# Patient Record
Sex: Male | Born: 1980 | Race: Black or African American | Hispanic: No | Marital: Single | Smoking: Never smoker
Health system: Southern US, Community
[De-identification: ages and names within clinical notes are randomized; demographics above are authoritative.]

## PROBLEM LIST (undated history)

## (undated) DIAGNOSIS — I8390 Asymptomatic varicose veins of unspecified lower extremity: Secondary | ICD-10-CM

## (undated) DIAGNOSIS — R011 Cardiac murmur, unspecified: Secondary | ICD-10-CM

## (undated) DIAGNOSIS — I519 Heart disease, unspecified: Secondary | ICD-10-CM

## (undated) DIAGNOSIS — J301 Allergic rhinitis due to pollen: Secondary | ICD-10-CM

## (undated) DIAGNOSIS — E039 Hypothyroidism, unspecified: Secondary | ICD-10-CM

## (undated) DIAGNOSIS — M1990 Unspecified osteoarthritis, unspecified site: Secondary | ICD-10-CM

## (undated) DIAGNOSIS — L708 Other acne: Secondary | ICD-10-CM

## (undated) DIAGNOSIS — D72819 Decreased white blood cell count, unspecified: Secondary | ICD-10-CM

## (undated) DIAGNOSIS — Q909 Down syndrome, unspecified: Secondary | ICD-10-CM

## (undated) HISTORY — DX: Other acne: L70.8

## (undated) HISTORY — PX: MITRAL VALVE REPAIR: SHX2039

## (undated) HISTORY — DX: Heart disease, unspecified: I51.9

## (undated) HISTORY — DX: Asymptomatic varicose veins of unspecified lower extremity: I83.90

## (undated) HISTORY — PX: OTHER SURGICAL HISTORY: SHX169

## (undated) HISTORY — DX: Allergic rhinitis due to pollen: J30.1

## (undated) HISTORY — DX: Decreased white blood cell count, unspecified: D72.819

## (undated) HISTORY — DX: Down syndrome, unspecified: Q90.9

---
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
## 2000-12-22 ENCOUNTER — Emergency Department (HOSPITAL_COMMUNITY): Admission: EM | Admit: 2000-12-22 | Discharge: 2000-12-22 | Payer: Self-pay | Admitting: Emergency Medicine

## 2001-06-30 ENCOUNTER — Ambulatory Visit (HOSPITAL_COMMUNITY): Admission: RE | Admit: 2001-06-30 | Discharge: 2001-06-30 | Payer: Self-pay | Admitting: Orthopedic Surgery

## 2001-06-30 ENCOUNTER — Encounter: Payer: Self-pay | Admitting: Orthopedic Surgery

## 2004-11-06 ENCOUNTER — Ambulatory Visit: Payer: Self-pay | Admitting: Cardiovascular Disease

## 2004-12-02 ENCOUNTER — Ambulatory Visit: Payer: Self-pay

## 2006-12-26 ENCOUNTER — Ambulatory Visit: Payer: Self-pay | Admitting: Cardiovascular Disease

## 2006-12-26 ENCOUNTER — Encounter: Payer: Self-pay | Admitting: Cardiovascular Disease

## 2006-12-26 ENCOUNTER — Ambulatory Visit: Payer: Self-pay

## 2008-03-29 ENCOUNTER — Ambulatory Visit: Payer: Self-pay | Admitting: Cardiovascular Disease

## 2008-04-08 ENCOUNTER — Encounter: Payer: Self-pay | Admitting: Cardiovascular Disease

## 2008-04-08 ENCOUNTER — Ambulatory Visit: Payer: Self-pay

## 2009-01-10 DIAGNOSIS — Q909 Down syndrome, unspecified: Secondary | ICD-10-CM | POA: Insufficient documentation

## 2009-01-10 DIAGNOSIS — I519 Heart disease, unspecified: Secondary | ICD-10-CM | POA: Insufficient documentation

## 2009-01-10 DIAGNOSIS — J301 Allergic rhinitis due to pollen: Secondary | ICD-10-CM | POA: Insufficient documentation

## 2009-01-10 DIAGNOSIS — L708 Other acne: Secondary | ICD-10-CM | POA: Insufficient documentation

## 2009-01-13 ENCOUNTER — Ambulatory Visit: Payer: Self-pay | Admitting: Cardiovascular Disease

## 2009-01-13 DIAGNOSIS — I080 Rheumatic disorders of both mitral and aortic valves: Secondary | ICD-10-CM | POA: Insufficient documentation

## 2009-01-13 DIAGNOSIS — I4510 Unspecified right bundle-branch block: Secondary | ICD-10-CM | POA: Insufficient documentation

## 2009-04-25 ENCOUNTER — Ambulatory Visit: Payer: Self-pay

## 2009-04-25 ENCOUNTER — Encounter: Payer: Self-pay | Admitting: Cardiovascular Disease

## 2009-11-19 ENCOUNTER — Encounter (INDEPENDENT_AMBULATORY_CARE_PROVIDER_SITE_OTHER): Payer: Self-pay | Admitting: *Deleted

## 2010-01-16 ENCOUNTER — Ambulatory Visit: Payer: Self-pay | Admitting: Cardiovascular Disease

## 2010-05-07 ENCOUNTER — Ambulatory Visit: Payer: Self-pay | Admitting: Cardiology

## 2010-05-07 ENCOUNTER — Encounter: Payer: Self-pay | Admitting: Cardiovascular Disease

## 2010-05-07 ENCOUNTER — Ambulatory Visit: Payer: Self-pay

## 2010-05-07 ENCOUNTER — Ambulatory Visit (HOSPITAL_COMMUNITY): Admission: RE | Admit: 2010-05-07 | Discharge: 2010-05-07 | Payer: Self-pay | Admitting: Cardiovascular Disease

## 2010-06-29 ENCOUNTER — Encounter: Admission: RE | Admit: 2010-06-29 | Discharge: 2010-06-29 | Payer: Self-pay | Admitting: Family Medicine

## 2010-07-06 ENCOUNTER — Telehealth: Payer: Self-pay | Admitting: Cardiovascular Disease

## 2010-07-09 ENCOUNTER — Encounter (INDEPENDENT_AMBULATORY_CARE_PROVIDER_SITE_OTHER): Payer: Self-pay | Admitting: *Deleted

## 2010-07-29 ENCOUNTER — Ambulatory Visit: Payer: Self-pay | Admitting: Oncology

## 2010-08-27 LAB — CBC WITH DIFFERENTIAL/PLATELET
BASO%: 0.5 % (ref 0.0–2.0)
Basophils Absolute: 0 10*3/uL (ref 0.0–0.1)
EOS%: 0.5 % (ref 0.0–7.0)
Eosinophils Absolute: 0 10*3/uL (ref 0.0–0.5)
HCT: 46.1 % (ref 38.4–49.9)
HGB: 16.1 g/dL (ref 13.0–17.1)
LYMPH%: 28.2 % (ref 14.0–49.0)
MCH: 34.1 pg — ABNORMAL HIGH (ref 27.2–33.4)
MCHC: 35 g/dL (ref 32.0–36.0)
MCV: 97.5 fL (ref 79.3–98.0)
MONO#: 0.4 10*3/uL (ref 0.1–0.9)
MONO%: 11.7 % (ref 0.0–14.0)
NEUT#: 2.2 10*3/uL (ref 1.5–6.5)
NEUT%: 59.1 % (ref 39.0–75.0)
Platelets: 155 10*3/uL (ref 140–400)
RBC: 4.73 10*6/uL (ref 4.20–5.82)
RDW: 13.6 % (ref 11.0–14.6)
WBC: 3.7 10*3/uL — ABNORMAL LOW (ref 4.0–10.3)
lymph#: 1 10*3/uL (ref 0.9–3.3)

## 2010-08-27 LAB — CHCC SMEAR

## 2010-08-27 LAB — MORPHOLOGY: PLT EST: ADEQUATE

## 2010-08-28 LAB — ANA: Anti Nuclear Antibody(ANA): POSITIVE — AB

## 2010-08-28 LAB — COMPREHENSIVE METABOLIC PANEL
ALT: 19 U/L (ref 0–53)
AST: 30 U/L (ref 0–37)
Albumin: 4.3 g/dL (ref 3.5–5.2)
Alkaline Phosphatase: 83 U/L (ref 39–117)
BUN: 13 mg/dL (ref 6–23)
CO2: 28 mEq/L (ref 19–32)
Calcium: 9 mg/dL (ref 8.4–10.5)
Chloride: 104 mEq/L (ref 96–112)
Creatinine, Ser: 1.04 mg/dL (ref 0.40–1.50)
Glucose, Bld: 93 mg/dL (ref 70–99)
Potassium: 4.7 mEq/L (ref 3.5–5.3)
Sodium: 142 mEq/L (ref 135–145)
Total Bilirubin: 0.6 mg/dL (ref 0.3–1.2)
Total Protein: 7.8 g/dL (ref 6.0–8.3)

## 2010-08-28 LAB — ANTI-NUCLEAR AB-TITER (ANA TITER): ANA Titer 1: 1:40 {titer} — ABNORMAL HIGH

## 2010-08-28 LAB — RHEUMATOID FACTOR: Rheumatoid fact SerPl-aCnc: <10 IU/mL (ref <=14)

## 2010-08-28 LAB — LACTATE DEHYDROGENASE: LDH: 171 U/L (ref 94–250)

## 2010-09-08 NOTE — Letter (Signed)
Summary: Generic Letter  [employer], Main Office  [address]   [place], [place] [address]   Phone: [phone]  Fax: [phone]        July 09, 2010 MRN: [id number]    [facility] [address] [place], [place]  [address]   TO WHOM IT MAY CONCERN:          Roberto Schultz is followed in our cardiology clinic for conditions related to his diagnosis of Down's syndrome. He is unable to make decisions on his own. Please take this into consideration when considering guardianship for [facility]. Please call with any questions or concerns.   Sincerely,  Dr Theron Arista Verdis Prime, RN

## 2010-09-08 NOTE — Assessment & Plan Note (Signed)
Summary: f1y/per check out      Allergies Added: NKDA  CC:  f1y/  Pt states he has been doing well.  History of Present Illness: Roberto Schultz is seen today in followup for congenital heart disease.  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  He's had it repaired.  There's been no residual RV or LV dysfunction.  He has no residual shunts.  His last echocardiogram was September of 2010.  He had normal LV function.  He did have prolapse of the posterior leaflet with what was described as moderate mitral regurg.  He will likely need a followup echocardiogram next year.  He continues to use SBE prophylaxis.  As of his endocardial cushion defect he has a chronic right bundle branch block with left axis deviation.  There's been no evidence of high-grade heart block.  He is living in a group home now he seems to be functioning well there.  He is not driving.  He has no new complaints since I last saw him.  In particular there is been no chest pain PND orthopnea dyspnea no palpitations syncope or evidence of congestive heart failure.    Current Problems (verified): 1)  Bundle Branch Block, Right  (ICD-426.4) 2)  Mitral Regurgitation, 2 Plus  (ICD-396.3) 3)  Down Syndrome  (ICD-758.0) 4)  Heart Disease  (ICD-429.9) 5)  Acne, Mild  (ICD-706.1) 6)  Allergic Rhinitis, Seasonal  (ICD-477.0)  Current Medications (verified): 1)  Cetirizine Hcl 10 Mg Tabs (Cetirizine Hcl) .Marland Kitchen.. 1 Tab By Mouth Once Daily 2)  Digoxin 0.25 Mg Tabs (Digoxin) .... Take One Tablet By Mouth Daily 3)  Minocycline Hcl 100 Mg Tabs (Minocycline Hcl) .Marland Kitchen.. 1 Tab By Mouth Two Times A Day 4)  Clindamycin Phosphate 1 % Soln (Clindamycin Phosphate) .... Apply Two Times A Day 5)  Desonide 0.05 % Crea (Desonide) .... Apply To Neck Two Times A Day 6)  Triamcinolone Acetonide 0.1 % Crea (Triamcinolone Acetonide) .... Apply To Back Two Times A Day 7)  Multivitamins  Tabs (Multiple Vitamin) .Marland Kitchen.. 1 Tab By Mouth Once  Daily  Allergies (verified): No Known Drug Allergies  Past History:  Past Medical History: Last updated: 01/10/2009 Current Problems:  DOWN SYNDROME (ICD-758.0) endocardial cushion defect repair HEART DISEASE (ICD-429.9) MVP with MR ACNE, MILD (ICD-706.1) ALLERGIC RHINITIS, SEASONAL (ICD-477.0)  Past Surgical History: Last updated: 01/10/2009  endocardial cushion defect repair.  Family History: Last updated: 01/10/2009 noncontributary  Social History: Last updated: 01/10/2009  He is still living at the home and goes to school.  He has been   fairly active.  He has a basketball court in his backyard and plays   almost daily.   Review of Systems       Denies fever, malais, weight loss, blurry vision, decreased visual acuity, cough, sputum, SOB, hemoptysis, pleuritic pain, palpitaitons, heartburn, abdominal pain, melena, lower extremity edema, claudication, or rash.   Vital Signs:  Patient profile:   30 year old male Height:      62 inches Weight:      131 pounds BMI:     24.05 Pulse rate:   59 / minute Pulse rhythm:   irregular BP sitting:   116 / 76  (left arm) Cuff size:   regular  Vitals Entered By: Judithe Modest CMA (January 16, 2010 3:21 PM)  Physical Exam  General:  Affect appropriate Healthy:  appears stated age HEENT: normal Neck supple with no adenopathy JVP normal no bruits no thyromegaly Lungs  clear with no wheezing and good diaphragmatic motion Heart:  S1/S2 MR  murmur, no rub, gallop or click PMI normal Abdomen: benighn, BS positve, no tenderness, no AAA no bruit.  No HSM or HJR Distal pulses intact with no bruits No edema LLE large varicose vein Neuro non-focal Skin warm and dry    Impression & Recommendations:  Problem # 1:  BUNDLE BRANCH BLOCK, RIGHT (ICD-426.4)  No evidence of high grade heart block  No change in ECG  Orders: EKG w/ Interpretation (93[address])  Problem # 2:  MITRAL REGURGITATION, 2 PLUS (ICD-396.3)  F/U echo in  September.  Murmur unchanged  Orders: EKG w/ Interpretation (93000) Echocardiogram (Echo)  Problem # 3:  HEART DISEASE (ICD-429.9) S/P endocardial cushon defect repair.  No residual shunt.  Continue SBE prophylaxis   Patient Instructions: 1)  Your physician recommends that you schedule a follow-up appointment in: YEAR WITH DR Eden Emms 2)  Your physician recommends that you continue on your current medications as directed. Please refer to the Current Medication list given to you today. 3)  Your physician has requested that you have an echocardiogram.  Echocardiography is a painless test that uses sound waves to create images of your heart. It provides your doctor with information about the size and shape of your heart and how well your heart's chambers and valves are working.  This procedure takes approximately one hour. There are no restrictions for this procedure.DUE SEPTEMBER   EKG Report  Procedure date:  01/16/2010  Findings:      SR 58 RBBB LAFB Bifasicular Block

## 2010-09-08 NOTE — Progress Notes (Signed)
Summary: Need letter for pt   Phone Note Call from Patient Call back at [phone] or [phone]   Caller: Mom/ Juanita Craver Summary of Call: Pt mother calling about getting letter for her son from Dr.Nishan Initial call taken by: Judie Grieve,  July 06, 2010 3:37 PM  Follow-up for Phone Call        Left message to call back Deliah Goody, RN  July 06, 2010 5:58 PM   pt's mom returning call-[phone] Glynda Jaeger  July 08, 2010 1:54 PM  spoke with pt mother, she is requesting a letter stating pt has downs syndrome and is unable to make decisions on his own so she can get guardianship for the group home he is staying in. will discuss with dr Verdis Prime, RN  July 08, 2010 2:36 PM   Additional Follow-up for Phone Call Additional follow up Details #1::        discussed with dr Eden Emms, note generated and mailed to pt home address Deliah Goody, RN  July 09, 2010 2:12 PM

## 2010-09-08 NOTE — Letter (Signed)
Summary: Appointment - Reminder 2  [employer], Main Office  [address]   [place], [place] [address]   Phone: [phone]  Fax: [phone]     November 19, 2009 MRN: [id number]   [facility] [address] [place], [place]  [address]   Dear Mr. Carranco,  Our records indicate that it is time to schedule a follow-up appointment with Dr. Eden Emms. It is very important that we reach you to schedule this appointment. We look forward to participating in your health care needs. Please contact us at the number listed above at your earliest convenience to schedule your appointment.  If you are unable to make an appointment at this time, give [place] a call so we can update our records.     Sincerely,   Migdalia Dk [facility] Scheduling Team

## 2010-12-22 NOTE — Assessment & Plan Note (Signed)
[facility]                            CARDIOLOGY OFFICE NOTE   VASCO, CHONG                    MRN:          [id number]  DATE:12/26/2006                            DOB:          [birth year]    Roberto Schultz returns today for followup.  He is a congenital heart disease  patient with previous endocardial cushion defect.   He has Down's Syndrome.   In talking to the patient and his mother, he has been doing extremely  well.  He is still living at the home and goes to school.  He has been  fairly active.  He has a basketball court in his backyard and plays  almost daily.   He has not had any significant PND or orthopnea, no palpitations or  syncope.   His last echo in April of 2006 showed no residual ASD or VSD.  He had a  mild aortic insufficiency and moderate mitral insufficiency.   He has not noticed any significant problems relevant to his heart.  His  teeth are good shape.  He does continue to take SBE prophylaxis.   The patient's primary care physician is Dr. Dorothe Pea.  He has had some  seasonal allergies but review of systems are otherwise negative.   MEDICATIONS:  He is on Digitek 0.25 mg a day, multi-vitamins.   EXAMINATION:  GENERAL:  Remarkable for a healthy-appearing young black  individual with a very happy affect.  He does have a mild speech  impediment.  He has a large keloid over his chest from his previous  congenital heart surgery.  His weight is 131 pounds.  Blood pressure is  118/70, pulse 61 and regular, respiratory rate is 12.  He is afebrile.  HEENT:  Normal.  NECK:  Carotids are normal without bruits.  JVP is not elevated.  There  is no thyromegaly, no lymphadenopathy.  LUNGS:  Clear with no wheezing and normal diaphragmatic motion.  Both  first and second heart sounds are split.  There is a mild aortic  insufficiency murmur and a mild MR murmur.  PMI is mildly increased but  not displaced.  There is a large keloid  over his chest.  ABDOMEN:  Benign.  Bowel sounds are positive.  There is no tenderness,  no hepatosplenomegaly, no hepatojugular reflux.  No renal bruits or AAA.  Distal pulses are intact with no edema.  NEURO:  Nonfocal.  MUSCULOSKELETAL:  Normal with no weakness.   EKG shows sinus rhythm with a PR interval of 170 and a right bundle  branch block.  These are chronic.   IMPRESSION:  Stable endocardial cushion defect without residual ASD or  VSD.  Patient has typical conduction defects that one would see with an  endocardial cushion defect.  His right bundle branch block and PR  interval are stable with no evidence of syncope or high-grade heart  block.   He will continue his digoxin, although I did not start him on this.  The  patient and mom have been somewhat hesitant to stop it.  If this heart  block were to worsen, we certainly can.  In regards to his valve disease, he needs a follow-up echo.  He will  continue SBE prophylaxis.  We will try to do his echo today in the  office.  So long as his Mr. and Marian Sorrow are stable, he will have a follow-up  echo in two years.   He has been taking his amoxicillin appropriately.   Overall, despite his Down's syndrome and previous congenital heart  disease, he is doing extremely well.     [name]. Eden Emms, MD, [facility]  Electronically Signed    PCN/MedQ  DD: 12/26/2006  DT: 12/26/2006  Job #: [id number]

## 2010-12-22 NOTE — Assessment & Plan Note (Signed)
[facility]                            CARDIOLOGY OFFICE NOTE   Roberto Schultz, Roberto Schultz                    MRN:          [id number]  DATE:03/29/2008                            DOB:          11-23-1980    Roberto Schultz returns today for followup.  He is 30 years old.  He has had  previous endocardial cushion defect repair.  He has had residual mild-to-  moderate MR and mild AI.  He needs to have a followup echocardiogram.   The patient has typical features of conduction delay from an endocardial  cushion defect.  He has had left axis deviation, relative bradycardia,  and a right bundle-branch block.  His previous doctors had placed him on  digoxin.  He was hesitant to stop this because he has had benign on it  so long.  I had previously explained the relationship between the age  and AV nodal block, but the patient and his parents wanted to continue  the drug.  Fortunately, his conduction abnormalities have not changed at  all.  He is not having any significant palpitations, presyncope,  syncope, or relative bradycardia.   He is active.  He is not having chest pain or shortness of breath.   His review of system is remarkable for the fact that Roberto Schultz is now  living in a group home, since he has been living in a group home.  His  skin condition is deteriorated some with some acne.  He is on both  topical and oral antibiotics for this.   Other than this, there had been no significant changes.   Roberto Schultz is doing well.  He continues to take life skill courses and  math courses at [employer] and he is working at [employer] in [place],  which sounds like a [employer].   CURRENT MEDICATIONS:  1. Multivitamin.  2. Zyrtec.  3. Lanoxin 0.25 a day.  4. Minocycline 100 b.i.d.  5. Clindamycin facial solution.   PHYSICAL EXAMINATION:  GENERAL:  His exam is remarkable for a older Down  syndrome black male in no distress.  VITAL SIGNS:  Weight is 138,  blood pressure is 126/75, pulse 55 and  regular, respiratory rate 14, afebrile.  HEENT:  Unremarkable.  CAROTIDS:  Normal without bruit.  No lymphadenopathy, no thyromegaly, no  JVP elevation.  LUNGS:  Clear to diaphragmatic motion.  No wheezing.  There is an S1-S2  with an MR murmur at the apex.  He is status post sternotomy from  congenital surgery from his endocardial cushion defect.  ABDOMEN:  Benign.  Bowel sounds positive.  No AAA, no tenderness, no  bruit, no hepatosplenomegaly, no hepatojugular reflux, no tenderness.  EXTREMITIES:  Distal pulses intact.  No edema.  NEURO:  Nonfocal.  SKIN:  Does have some acne over the back and malar regions of the face.  NEURO:  Nonfocal.  No muscular weakness.   EKG shows sinus rhythm with left axis deviation and right bundle-branch  block.  PR interval is 170.  QT interval is 446.   IMPRESSION:  1. Endocardial cushion defect, status post surgery.  No  evidence of      residual shunt.  2. Mild-to-moderate mitral regurgitation and mild aortic      insufficiency.  Followup echocardiogram to assess severity.  3. Conduction abnormality secondary to endocardial cushion defect,      left axis deviation, right bundle, low threshold, to stop digoxin,      no evidence of chronotropic incompetence at this time.  4. Seasonal allergies.  Continue Zyrtec.  5. Acne.  Continue minocycline and clindamycin.  Followup with      Dermatology.   I will see him back in a year so long as his echo has not changed so  much.     [name]. Eden Emms, MD, [facility]  Electronically Signed    PCN/MedQ  DD: 03/29/2008  DT: 03/30/2008  Job #: [id number]

## 2011-01-19 ENCOUNTER — Encounter: Payer: Self-pay | Admitting: Cardiovascular Disease

## 2011-01-20 ENCOUNTER — Ambulatory Visit (INDEPENDENT_AMBULATORY_CARE_PROVIDER_SITE_OTHER): Payer: Medicaid Other | Admitting: Cardiovascular Disease

## 2011-01-20 ENCOUNTER — Encounter: Payer: Self-pay | Admitting: Cardiovascular Disease

## 2011-01-20 DIAGNOSIS — I4510 Unspecified right bundle-branch block: Secondary | ICD-10-CM

## 2011-01-20 DIAGNOSIS — I080 Rheumatic disorders of both mitral and aortic valves: Secondary | ICD-10-CM

## 2011-01-20 DIAGNOSIS — Q909 Down syndrome, unspecified: Secondary | ICD-10-CM

## 2011-01-20 NOTE — Progress Notes (Signed)
30 yo with Downs Syndrome living well at group home.  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  Normal EF  Last echo 9/11 reviewed.  No dyspnea, palpitations, SSCP, edema.  No longer playing basketball.  Seeing dentist at least twice a year.  His dad was with him today.  Older sister doing well and graduated with job at credit union.  Middle son not so well hanging around "rappers" and has an unwed child he is not supporting.  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  No evidence of progressive heart block  ROS: Denies fever, malais, weight loss, blurry vision, decreased visual acuity, cough, sputum, SOB, hemoptysis, pleuritic pain, palpitaitons, heartburn, abdominal pain, melena, lower extremity edema, claudication, or rash.  All other systems reviewed and negative  General: Affect appropriate Healthy: with Down Syndrome  HEENT: normal Neck supple with no adenopathy JVP normal no bruits no thyromegaly Lungs clear with no wheezing and good diaphragmatic motion Heart:  S1 split /S2 Mild AR  murmur,rub, gallop or click PMI normal Abdomen: benighn, BS positve, no tenderness, no AAA no bruit.  No HSM or HJR Distal pulses intact with no bruits No edema Neuro non-focal Skin warm and dry No muscular weakness   Current Outpatient Prescriptions  Medication Sig Dispense Refill  . cetirizine (ZYRTEC) 10 MG chewable tablet Chew 10 mg by mouth daily.        . digoxin (LANOXIN) 0.25 MG tablet Take 250 mcg by mouth daily.        . Multiple Vitamin (MULTIVITAMIN) tablet Take 1 tablet by mouth daily.          Allergies  Review of patient's allergies indicates no known allergies.  Electrocardiogram:   NSR 65 LAD RBBB no change frmo 2011  Assessment and Plan

## 2011-01-20 NOTE — Assessment & Plan Note (Signed)
Stable secondary to AV canal defect.  Yearly ECG

## 2011-01-20 NOTE — Patient Instructions (Signed)
Your physician wants you to follow-up in: one year You will receive a reminder letter in the mail two months in advance. If you don't receive a letter, please call our office to schedule the follow-up appointment.  

## 2011-01-20 NOTE — Assessment & Plan Note (Signed)
Doing well living independantly at group home

## 2011-01-20 NOTE — Assessment & Plan Note (Signed)
Mild.  Only mild AR heard on exam with split S1  F/U echo 9/13

## 2011-02-25 ENCOUNTER — Encounter (HOSPITAL_BASED_OUTPATIENT_CLINIC_OR_DEPARTMENT_OTHER): Payer: Medicaid Other | Admitting: Oncology

## 2011-02-25 ENCOUNTER — Other Ambulatory Visit (HOSPITAL_COMMUNITY): Payer: Self-pay | Admitting: Oncology

## 2011-02-25 DIAGNOSIS — D72819 Decreased white blood cell count, unspecified: Secondary | ICD-10-CM

## 2011-02-25 LAB — CBC WITH DIFFERENTIAL/PLATELET
BASO%: 0.4 % (ref 0.0–2.0)
Basophils Absolute: 0 10*3/uL (ref 0.0–0.1)
HCT: 45.2 % (ref 38.4–49.9)
HGB: 15.6 g/dL (ref 13.0–17.1)
MCHC: 34.6 g/dL (ref 32.0–36.0)
MONO#: 0.5 10*3/uL (ref 0.1–0.9)
NEUT#: 1.6 10*3/uL (ref 1.5–6.5)
NEUT%: 47.9 % (ref 39.0–75.0)
WBC: 3.4 10*3/uL — ABNORMAL LOW (ref 4.0–10.3)
lymph#: 1.2 10*3/uL (ref 0.9–3.3)

## 2011-06-14 ENCOUNTER — Other Ambulatory Visit: Payer: Self-pay | Admitting: Family Medicine

## 2011-06-14 ENCOUNTER — Ambulatory Visit
Admission: RE | Admit: 2011-06-14 | Discharge: 2011-06-14 | Disposition: A | Discharge status: Home / Self Care | Payer: Medicaid Other | Source: Ambulatory Visit | Attending: Family Medicine | Admitting: Family Medicine

## 2011-06-14 DIAGNOSIS — S93409A Sprain of unspecified ligament of unspecified ankle, initial encounter: Secondary | ICD-10-CM

## 2011-08-06 ENCOUNTER — Telehealth: Payer: Self-pay | Admitting: Oncology

## 2011-08-06 NOTE — Telephone Encounter (Signed)
S/w the pt and he is aware of his feb 2013 appts °

## 2011-09-16 ENCOUNTER — Telehealth: Payer: Self-pay | Admitting: Medical Oncology

## 2011-09-16 NOTE — Telephone Encounter (Signed)
Roberto Schultz at group home called to ask about appointments next week. Gave her dates  For appointment and time.

## 2011-09-20 ENCOUNTER — Encounter: Payer: Self-pay | Admitting: Oncology

## 2011-09-20 ENCOUNTER — Other Ambulatory Visit: Payer: Medicaid Other | Admitting: Lab

## 2011-09-20 ENCOUNTER — Ambulatory Visit (HOSPITAL_BASED_OUTPATIENT_CLINIC_OR_DEPARTMENT_OTHER): Payer: Medicaid Other | Admitting: Oncology

## 2011-09-20 VITALS — BP 108/69 | HR 78 | Temp 97.4°F | Ht 63.0 in | Wt 133.6 lb

## 2011-09-20 DIAGNOSIS — D72819 Decreased white blood cell count, unspecified: Secondary | ICD-10-CM

## 2011-09-20 LAB — CBC WITH DIFFERENTIAL/PLATELET
BASO%: 0.3 % (ref 0.0–2.0)
EOS%: 0.2 % (ref 0.0–7.0)
HCT: 46.3 % (ref 38.4–49.9)
LYMPH%: 23 % (ref 14.0–49.0)
MCH: 33.8 pg — ABNORMAL HIGH (ref 27.2–33.4)
MCHC: 34.4 g/dL (ref 32.0–36.0)
MONO#: 0.5 10*3/uL (ref 0.1–0.9)
NEUT%: 64.4 % (ref 39.0–75.0)
Platelets: 177 10*3/uL (ref 140–400)
RBC: 4.72 10*6/uL (ref 4.20–5.82)
WBC: 4.4 10*3/uL (ref 4.0–10.3)

## 2011-09-20 LAB — COMPREHENSIVE METABOLIC PANEL
ALT: 17 U/L (ref 0–53)
AST: 25 U/L (ref 0–37)
Alkaline Phosphatase: 74 U/L (ref 39–117)
Creatinine, Ser: 1.14 mg/dL (ref 0.50–1.35)
Sodium: 138 mEq/L (ref 135–145)
Total Bilirubin: 0.7 mg/dL (ref 0.3–1.2)
Total Protein: 7.4 g/dL (ref 6.0–8.3)

## 2011-09-20 NOTE — Progress Notes (Signed)
CC:   Roberto Koirala, MD  PROBLEM LIST: 1. Leukopenia. 2. Down syndrome. 3. Status post mitral valve repair at age 31 months. 4. Eczema. 5. Status post right hip fracture requiring surgical pinning in 1996.  MEDICATIONS: 1. Benzoyl peroxide 5% gel applied daily. 2. Zyrtec 10 mg chewable tablets daily. 3. Cleocin 1% lotion applied twice a day. 4. Desonide 0.05% cream, apply as needed. 5. Digoxin 0.25 mg daily. 6. Flonase nasal spray 2 sprays as needed. 7. Minocycline 100 mg twice daily. 8. Multivitamins 1 daily. 9. Kenalog 0.1% cream, apply as needed.  HISTORY:  Roberto Schultz is here today with his dad, [name].  Roberto Schultz was last seen by [place] on 02/25/2011, at which time his white count was 3.4. We had 1st seen Roberto Schultz on referral on 08/27/2010 for evaluation of leukopenia.  At that time, his white count was 3.7 with an ANC of 2.2. The rest of his CBC was normal.  It was our feelings at that time that his leukopenia was not clinically significant, although I could not exclude the possibility that 1 or more of his medicines might have been a contributing factor.  Roberto Schultz has done well and has no problems or complaints.  He resides in a group home called [facility] run by [facility].  The facility is located in [facility].  Roberto Schultz works several days a week at [employer].  He has not had any infections or fever.  PHYSICAL EXAMINATION:  General:  He is of short stature, very friendly and pleasant and cooperative.  He seems to be fairly high functioning. Vital Signs:  Weight is 133 pounds 9.6 ounces, height 5 feet 3 inches, body surface area 1.64 sq m.  Blood pressure 108/69.  Other vital signs are normal.  HEENT:  There is no scleral icterus.  Mouth and pharynx are benign.  There is no peripheral adenopathy palpable.  Heart and Lungs: Normal.  There is a prominent scar from his cardiac surgery.  Abdomen: With the patient sitting is benign with no organomegaly or masses palpable.  Extremities:  No  peripheral edema or clubbing.  Neurologic Exam:  Grossly normal.  LABORATORY DATA:  Today white count 4.4, ANC 2.8, hemoglobin 15.9, hematocrit 46.3, platelets 177,000.  MCV and MCH were minimally elevated at 98.1 and 33.8 respectively.  Chemistries are pending.  We have chemistries from 08/27/2010 that were normal.  Apparently Roberto Schultz had recently a slightly elevated TSH and a slightly low thyroid hormone. These tests will be repeated.  IMPRESSION AND PLAN:  Roberto Schultz was originally seen by [place] for mild leukopenia.  Today his CBC is normal.  We last saw Roberto Schultz on 02/25/2011. It has been our impression that he has a mild benign leukopenia without any clinical significance.  Once again, the patient and his dad were reassured.  We gave a copy of the CBCs to the patient's father.  I do not think Roberto Schultz needs to be seen by [place] again, although I would be happy to see him should any questions or concerns arise in the future.    ______________________________ Samul Dada, M.D. DSM/MEDQ  D:  09/20/2011  T:  09/20/2011  Job:  [id number]

## 2011-09-20 NOTE — Progress Notes (Signed)
This office note has been dictated.  #[id number]

## 2011-11-04 ENCOUNTER — Other Ambulatory Visit: Payer: Self-pay | Admitting: Family Medicine

## 2011-11-04 ENCOUNTER — Ambulatory Visit
Admission: RE | Admit: 2011-11-04 | Discharge: 2011-11-04 | Disposition: A | Discharge status: Home / Self Care | Payer: Medicaid Other | Source: Ambulatory Visit | Attending: Family Medicine | Admitting: Family Medicine

## 2011-11-04 DIAGNOSIS — M25551 Pain in right hip: Secondary | ICD-10-CM

## 2011-11-04 DIAGNOSIS — M25552 Pain in left hip: Secondary | ICD-10-CM

## 2012-04-12 ENCOUNTER — Ambulatory Visit (INDEPENDENT_AMBULATORY_CARE_PROVIDER_SITE_OTHER): Payer: Medicaid Other | Admitting: Cardiovascular Disease

## 2012-04-12 ENCOUNTER — Encounter: Payer: Self-pay | Admitting: Cardiovascular Disease

## 2012-04-12 VITALS — BP 104/60 | HR 58 | Ht 60.0 in | Wt 138.0 lb

## 2012-04-12 DIAGNOSIS — I4510 Unspecified right bundle-branch block: Secondary | ICD-10-CM

## 2012-04-12 DIAGNOSIS — R011 Cardiac murmur, unspecified: Secondary | ICD-10-CM

## 2012-04-12 DIAGNOSIS — Z79899 Other long term (current) drug therapy: Secondary | ICD-10-CM

## 2012-04-12 DIAGNOSIS — I8390 Asymptomatic varicose veins of unspecified lower extremity: Secondary | ICD-10-CM

## 2012-04-12 LAB — CBC WITH DIFFERENTIAL/PLATELET
Basophils Absolute: 0 10*3/uL (ref 0.0–0.1)
Eosinophils Absolute: 0 10*3/uL (ref 0.0–0.7)
Hemoglobin: 15.1 g/dL (ref 13.0–17.0)
Lymphocytes Relative: 30.4 % (ref 12.0–46.0)
MCHC: 33.6 g/dL (ref 30.0–36.0)
MCV: 98.4 fl (ref 78.0–100.0)
Monocytes Absolute: 0.5 10*3/uL (ref 0.1–1.0)
Neutro Abs: 1.5 10*3/uL (ref 1.4–7.7)
RDW: 13 % (ref 11.5–14.6)

## 2012-04-12 LAB — BASIC METABOLIC PANEL
BUN: 14 mg/dL (ref 6–23)
Calcium: 8.8 mg/dL (ref 8.4–10.5)
Chloride: 102 mEq/L (ref 96–112)
Creatinine, Ser: 1.2 mg/dL (ref 0.4–1.5)
GFR: 95.53 mL/min (ref >60.00)

## 2012-04-12 NOTE — Progress Notes (Signed)
Patient ID: Roberto Schultz, male   DOB: 1980-09-28, 31 y.o.   MRN: [id number] 31 yo with Downs Syndrome living well at group home. 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 Normal EF Last echo 9/11 reviewed. No dyspnea, palpitations, SSCP, edema. No longer playing basketball. Seeing dentist at least twice a year. His dad was with him today. Older sister doing well and graduated with job at credit union. Middle son not so well hanging around "rappers" and has an unwed child he is not supporting. 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 No evidence of progressive heart block  W/U for leukopenia Dr Arline Asp ? Related to antibiotic stopped and improving.  Has bad varicose veins and rash on back.    ROS: Denies fever, malais, weight loss, blurry vision, decreased visual acuity, cough, sputum, SOB, hemoptysis, pleuritic pain, palpitaitons, heartburn, abdominal pain, melena, lower extremity edema, claudication, or rash.  All other systems reviewed and negative  General: Affect appropriate Healthy:  appears stated age HEENT: normal Downs syndrome facies Neck supple with no adenopathy JVP normal no bruits no thyromegaly Lungs clear with no wheezing and good diaphragmatic motion Heart:  S1/S2 SEM murmur, no rub, gallop or click PMI normal Abdomen: benighn, BS positve, no tenderness, no AAA no bruit.  No HSM or HJR Distal pulses intact with no bruits Plus one bilateral  Edema with marked varicose veins Neuro non-focal Skin warm and dry No muscular weakness   Current Outpatient Prescriptions  Medication Sig Dispense Refill  . benzoyl peroxide 5 % gel Apply topically daily.      . cetirizine (ZYRTEC) 10 MG chewable tablet Chew 10 mg by mouth daily.        . clindamycin (CLEOCIN T) 1 % lotion Apply topically 2 (two) times daily.      Marland Kitchen desonide (DESOWEN) 0.05 % cream Apply topically as needed.      . digoxin (LANOXIN) 0.25 MG tablet Take 250 mcg by mouth daily.        . fluticasone (FLONASE) 50 MCG/ACT  nasal spray Place 2 sprays into the nose as needed.      . minocycline (DYNACIN) 100 MG tablet Take 100 mg by mouth 2 (two) times daily.      . Multiple Vitamin (MULTIVITAMIN) tablet Take 1 tablet by mouth daily.        Marland Kitchen triamcinolone cream (KENALOG) 0.1 % Apply topically as needed.        Allergies  Review of patient's allergies indicates no known allergies.  Electrocardiogram:  NSR rate 49 RBBB no change from previous  Assessment and Plan

## 2012-04-12 NOTE — Assessment & Plan Note (Signed)
No change in murmur S/P endocardial cushion defect repair.  SBE prophylaxis  Echo

## 2012-04-12 NOTE — Patient Instructions (Signed)
Your physician wants you to follow-up in: YEAR WITH DR Haywood Filler will receive a reminder letter in the mail two months in advance. If you don't receive a letter, please call our office to schedule the follow-up appointment. Your physician recommends that you continue on your current medications as directed. Please refer to the Current Medication list given to you today. Your physician recommends that you return for lab work in: TODAY BMET CBC AND DIG LEVEL Your physician has requested that you have an echocardiogram. Echocardiography is a painless test that uses sound waves to create images of your heart. It provides your doctor with information about the size and shape of your heart and how well your heart's chambers and valves are working. This procedure takes approximately one hour. There are no restrictions for this procedure. DX MURMUR

## 2012-04-12 NOTE — Assessment & Plan Note (Signed)
Refer to Dr Donia Ast at vein center for evaluation.

## 2012-04-12 NOTE — Assessment & Plan Note (Signed)
Improving ? Drug related CBC with lab work today

## 2012-04-12 NOTE — Assessment & Plan Note (Addendum)
ECG stable Conduction abnormality related to cushion defect.  Yearly ECG Check digoxen level will likely D/C this med

## 2012-04-13 ENCOUNTER — Telehealth: Payer: Self-pay | Admitting: Cardiovascular Disease

## 2012-04-13 LAB — DIGOXIN LEVEL: Digoxin Level: 1.4 ng/mL (ref 0.8–2.0)

## 2012-04-13 NOTE — Telephone Encounter (Signed)
New Problem:    Patient's father called returning your call.  Please call back.

## 2012-04-13 NOTE — Telephone Encounter (Signed)
PT'S DAD AWARE OF LAB RESULTS  AND MED CHANGED ALSO FAXED ORDER TO PT'S GROUP HOME FOR PT TO STOP DIGOXIN./CY

## 2012-04-25 ENCOUNTER — Ambulatory Visit (HOSPITAL_COMMUNITY): Payer: Medicaid Other | Attending: Cardiovascular Disease | Admitting: Radiology

## 2012-04-25 DIAGNOSIS — I369 Nonrheumatic tricuspid valve disorder, unspecified: Secondary | ICD-10-CM | POA: Insufficient documentation

## 2012-04-25 DIAGNOSIS — R011 Cardiac murmur, unspecified: Secondary | ICD-10-CM

## 2012-04-25 DIAGNOSIS — I379 Nonrheumatic pulmonary valve disorder, unspecified: Secondary | ICD-10-CM | POA: Insufficient documentation

## 2012-04-25 DIAGNOSIS — I080 Rheumatic disorders of both mitral and aortic valves: Secondary | ICD-10-CM | POA: Insufficient documentation

## 2012-04-25 DIAGNOSIS — Q909 Down syndrome, unspecified: Secondary | ICD-10-CM | POA: Insufficient documentation

## 2012-04-25 NOTE — Progress Notes (Signed)
Echocardiogram performed.  

## 2012-06-21 ENCOUNTER — Telehealth: Payer: Self-pay | Admitting: Cardiovascular Disease

## 2012-06-21 NOTE — Telephone Encounter (Signed)
New problem:    Patient was seen in office yesterday, disclose that he had stop his lanoxin. Office calling back to clarify.

## 2012-06-21 NOTE — Telephone Encounter (Signed)
AWARE  DIGOXIN WAS STOPPED SEE LABS FROM  04-12-12 .Zack Seal

## 2013-05-04 ENCOUNTER — Encounter: Payer: Self-pay | Admitting: Cardiovascular Disease

## 2013-05-04 ENCOUNTER — Ambulatory Visit (INDEPENDENT_AMBULATORY_CARE_PROVIDER_SITE_OTHER): Payer: Medicaid Other | Admitting: Cardiovascular Disease

## 2013-05-04 VITALS — BP 120/78 | HR 57 | Ht 60.0 in | Wt 138.0 lb

## 2013-05-04 DIAGNOSIS — I4510 Unspecified right bundle-branch block: Secondary | ICD-10-CM

## 2013-05-04 DIAGNOSIS — I080 Rheumatic disorders of both mitral and aortic valves: Secondary | ICD-10-CM

## 2013-05-04 NOTE — Patient Instructions (Addendum)
Your physician wants you to follow-up in: YEAR WITH DR NISHAN  You will receive a reminder letter in the mail two months in advance. If you don't receive a letter, please call our office to schedule the follow-up appointment.  Your physician recommends that you continue on your current medications as directed. Please refer to the Current Medication list given to you today. 

## 2013-05-04 NOTE — Assessment & Plan Note (Signed)
Mild by echo 9/13  Consider echo in a year

## 2013-05-04 NOTE — Assessment & Plan Note (Signed)
Stable Despite AV canal defect/repair no high grade heart block

## 2013-05-04 NOTE — Progress Notes (Signed)
Patient ID: Roberto Schultz, male   DOB: 1981/04/11, 32 y.o.   MRN: [id number] 32 yo with Downs Syndrome living well at group home. 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 Normal EF Last echo 9/11 reviewed. No dyspnea, palpitations, SSCP, edema. No longer playing basketball. Seeing dentist at least twice a year. His dad was with him today. Older sister doing well and graduated with job at [employer]. Middle son not so well hanging around "rappers" and has an unwed child he is not supporting. 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 No evidence of progressive heart block   Has had some leukopenia ? Related to antibiotics  Seeing [facility]  ROS: Denies fever, malais, weight loss, blurry vision, decreased visual acuity, cough, sputum, SOB, hemoptysis, pleuritic pain, palpitaitons, heartburn, abdominal pain, melena, lower extremity edema, claudication, or rash.  All other systems reviewed and negative  General: Affect appropriate Down Syndrome Facies  HEENT: normal Neck supple with no adenopathy JVP normal no bruits no thyromegaly Lungs clear with no wheezing and good diaphragmatic motion Heart:  S1/S2 no murmur, no rub, gallop or click PMI normal Abdomen: benighn, BS positve, no tenderness, no AAA no bruit.  No HSM or HJR Distal pulses intact with no bruits No edema Neuro non-focal Skin warm and dry No muscular weakness   Current Outpatient Prescriptions  Medication Sig Dispense Refill  . benzoyl peroxide 5 % gel Apply topically daily.      . cetirizine (ZYRTEC) 10 MG chewable tablet Chew 10 mg by mouth daily.        Marland Kitchen desonide (DESOWEN) 0.05 % cream Apply topically as needed.      . digoxin (LANOXIN) 0.25 MG tablet Take 250 mcg by mouth daily.        . fluticasone (FLONASE) 50 MCG/ACT nasal spray Place 2 sprays into the nose as needed.      . Multiple Vitamin (MULTIVITAMIN) tablet Take 1 tablet by mouth daily.        Marland Kitchen triamcinolone cream (KENALOG) 0.1 % Apply topically as needed.       No  current facility-administered medications for this visit.    Allergies  Review of patient's allergies indicates no known allergies.  Electrocardiogram:  04/12/12  SR rate 49  RBBB  Today SB rate 57 LAD RBBB   Assessment and Plan

## 2013-07-10 ENCOUNTER — Telehealth: Payer: Self-pay | Admitting: Cardiovascular Disease

## 2013-07-10 NOTE — Telephone Encounter (Signed)
New message  Pt father called -- Did the patient have a flu shot// with yearly check up// called triage to confirm the flu shot was given// Spoke with Annice Pih who checked the chart and confirmed that there is no indication that the pt had a flu shot within our facility.// No need to call back.

## 2015-09-25 NOTE — Progress Notes (Signed)
Patient ID: Roberto Schultz, male   DOB: [birth year], 35 y.o.   MRN: [id number] 35 y.o. with Downs Syndrome living well at group home. 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 Normal EF Last echo 9/11 reviewed. No dyspnea, palpitations, SSCP, edema. No longer playing basketball. Seeing dentist at least twice a year. His dad was with him today. Older sister doing well and graduated with job at credit union. Middle son not so well hanging around "rappers" and has an unwed child he is not supporting. 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 No evidence of progressive heart block   Has had some leukopenia ? Related to antibiotics  Seeing [facility]inson  Living in new group home more age specific [name] Dad indicates he is doing well    ROS: Denies fever, malais, weight loss, blurry vision, decreased visual acuity, cough, sputum, SOB, hemoptysis, pleuritic pain, palpitaitons, heartburn, abdominal pain, melena, lower extremity edema, claudication, or rash.  All other systems reviewed and negative  General: Affect appropriate Down Syndrome Facies  HEENT: normal Neck supple with no adenopathy JVP normal no bruits no thyromegaly Lungs clear with no wheezing and good diaphragmatic motion Heart:  S1/S2 no murmur, no rub, gallop or click PMI normal Abdomen: benighn, BS positve, no tenderness, no AAA no bruit.  No HSM or HJR Distal pulses intact with no bruits No edema Neuro non-focal Skin warm and dry No muscular weakness   Current Outpatient Prescriptions  Medication Sig Dispense Refill  . cetirizine (ZYRTEC) 10 MG chewable tablet Chew 10 mg by mouth daily.      . fluticasone (FLONASE) 50 MCG/ACT nasal spray Place 2 sprays into the nose daily as needed for allergies or rhinitis.     . Multiple Vitamin (MULTIVITAMIN) tablet Take 1 tablet by mouth daily.      . SODIUM FLUORIDE PO Take by mouth as directed.    . thyroid (ARMOUR) 15 MG tablet Take 15 mg by mouth daily.    Marland Kitchen thyroid (ARMOUR) 30 MG tablet Take  30 mg by mouth daily before breakfast.     No current facility-administered medications for this visit.    Allergies  Review of patient's allergies indicates no known allergies.  Electrocardiogram:  04/12/12  SR rate 49  RBBB  05/04/13 SB rate 57 LAD RBBB   10/02/15  SR rate 58  RBBB LAD no change   Assessment and Plan  Congenital heart Disease:  Post AV canal repair with mild AR/MR on echo 2013 Allergies:  On syrtec and flonase Thyroid:  On natural armour replacement RBBB:  Related to AV canal defect and repair stable over the years f/u ECG in a year    [employer]

## 2015-10-03 ENCOUNTER — Encounter: Payer: Self-pay | Admitting: Cardiovascular Disease

## 2015-10-03 ENCOUNTER — Ambulatory Visit (INDEPENDENT_AMBULATORY_CARE_PROVIDER_SITE_OTHER): Payer: Medicaid Other | Admitting: Cardiovascular Disease

## 2015-10-03 VITALS — BP 114/78 | HR 58 | Ht 60.0 in | Wt 133.0 lb

## 2015-10-03 DIAGNOSIS — I340 Nonrheumatic mitral (valve) insufficiency: Secondary | ICD-10-CM

## 2015-10-03 NOTE — Patient Instructions (Addendum)

## 2016-05-03 ENCOUNTER — Encounter: Payer: Self-pay | Admitting: Vascular Surgery

## 2016-05-04 ENCOUNTER — Ambulatory Visit (INDEPENDENT_AMBULATORY_CARE_PROVIDER_SITE_OTHER): Payer: Medicaid Other | Admitting: Vascular Surgery

## 2016-05-04 ENCOUNTER — Encounter: Payer: Self-pay | Admitting: Vascular Surgery

## 2016-05-04 VITALS — BP 128/68 | HR 63 | Temp 97.3°F | Resp 16 | Ht 60.0 in | Wt 127.0 lb

## 2016-05-04 DIAGNOSIS — I83893 Varicose veins of bilateral lower extremities with other complications: Secondary | ICD-10-CM

## 2016-05-04 DIAGNOSIS — I83892 Varicose veins of left lower extremities with other complications: Secondary | ICD-10-CM | POA: Insufficient documentation

## 2016-05-04 NOTE — Progress Notes (Signed)
Subjective:     Patient ID: Roberto Schultz, male   DOB: 10/18/1980, 35 y.o.   MRN: [id number]  HPI This 35 year old patient with Down syndrome was referred by Roberto Schultz for evaluation of enlarging varicose veins in both lower extremities. Patient has no history of DVT. He is a [place] by his father Roberto Schultz. He does have worsening edema of both legs from the knee to the ankle with large bulging varicosities in both legs. He's had no history of stasis ulcers bleeding or thrombophlebitis or other complications. He does not elastic compression stockings. He has not had previous treatment.  Past Medical History:  Diagnosis Date  . Allergic rhinitis due to pollen   . Down syndrome   . Heart disease, unspecified   . Leukocytopenia, unspecified   . Other acne   . Varicose veins     Social History  Substance Use Topics  . Smoking status: Never Smoker  . Smokeless tobacco: Never Used  . Alcohol use No    Family History  Problem Relation Age of Onset  . Cancer Father     ALS  . Other      noncontributory    No Known Allergies   Current Outpatient Prescriptions:  .  Black Currant Seed Oil 500 MG CAPS, Take 500 mg by mouth daily., Disp: , Rfl:  .  cetirizine (ZYRTEC) 10 MG chewable tablet, Chew 10 mg by mouth daily.  , Disp: , Rfl:  .  COD LIVER OIL PO, Take by mouth daily., Disp: , Rfl:  .  fluticasone (FLONASE) 50 MCG/ACT nasal spray, Place 2 sprays into the nose daily as needed for allergies or rhinitis. , Disp: , Rfl:  .  Multiple Vitamin (MULTIVITAMIN) tablet, Take 1 tablet by mouth daily.  , Disp: , Rfl:  .  SODIUM FLUORIDE PO, Take by mouth as directed., Disp: , Rfl:  .  thyroid (ARMOUR) 15 MG tablet, Take 15 mg by mouth daily., Disp: , Rfl:  .  thyroid (ARMOUR) 30 MG tablet, Take 30 mg by mouth daily before breakfast., Disp: , Rfl:   Vitals:   05/04/16 1515  BP: 128/68  Pulse: 63  Resp: 16  Temp: 97.3 F (36.3 C)  SpO2: 100%  Weight: 127 lb (57.6 kg)  Height: 5'  (1.524 m)    Body mass index is 24.8 kg/m.         Review of Systems Has history of cardiac surgery at age 35 months-correction of AV Canal defect. Denies chest pain, dyspnea on exertion, PND, orthopnea, hemoptysis. Does have history of hypothyroidism. Has Down syndrome. Otherwise negative review of systems    Objective:   Physical Exam BP 128/68 (BP Location: Left Arm, Patient Position: Sitting, Cuff Size: Normal)   Pulse 63   Temp 97.3 F (36.3 C)   Resp 16   Ht 5' (1.524 m)   Wt 127 lb (57.6 kg)   SpO2 100%   BMI 24.80 kg/m     Gen.-alert and oriented x3 in no apparent distress HEENT normal for age Lungs no rhonchi or wheezing Cardiovascular regular rhythm no murmurs carotid pulses 3+ palpable no bruits audible Abdomen soft nontender no palpable masses Musculoskeletal free of  major deformities Skin clear -no rashes Neurologic normal Lower extremities 3+ femoral and dorsalis pedis pulses palpable bilaterally with 1+ edema bilaterally midcalf distally. Large bulging varicosities over great saphenous system bilaterally beginning in the mid calf extending into the medial malleolar area with no active ulcer. Early hyperpigmentation. No bulging varicosities  in the thighs. Saphenous veins are quite large visible through the skin and the calf area medially.  Today I performed a bedside SonoSite ultrasound exam. Great saphenous veins bilaterally are very large with gross reflux throughout up to the saphenofemoral junction      Assessment:     Painful varicosities bilaterally with chronic edema-worsening due to gross reflux bilateral great saphenous veins Down's  Syndrome History of cardiac surgery at age 35 months Hypothyroidism    Plan:         #1 long leg elastic compression stockings 20-30 mm gradient #2 elevate legs as much as possible #3 ibuprofen daily on a regular basis for pain #4 return in 3 months-formal venous duplex exam will be performed prior to  return We'll make formal recommendations at next visit He will likely need bilateral laser ablation great saphenous veins with multiple stab phlebectomy of painful varicosities Return in 3 months

## 2016-05-05 ENCOUNTER — Other Ambulatory Visit: Payer: Self-pay | Admitting: *Deleted

## 2016-05-05 DIAGNOSIS — I83893 Varicose veins of bilateral lower extremities with other complications: Secondary | ICD-10-CM

## 2016-06-22 ENCOUNTER — Encounter: Payer: Medicaid Other | Admitting: Vascular Surgery

## 2016-07-30 ENCOUNTER — Encounter: Payer: Self-pay | Admitting: Vascular Surgery

## 2016-08-17 ENCOUNTER — Ambulatory Visit (HOSPITAL_COMMUNITY): Payer: Medicaid Other

## 2016-08-17 ENCOUNTER — Encounter (HOSPITAL_COMMUNITY): Payer: Medicaid Other

## 2016-08-17 ENCOUNTER — Ambulatory Visit (INDEPENDENT_AMBULATORY_CARE_PROVIDER_SITE_OTHER): Payer: Medicare Other | Admitting: Vascular Surgery

## 2016-08-17 ENCOUNTER — Ambulatory Visit: Payer: Medicare Other | Admitting: Vascular Surgery

## 2016-08-17 ENCOUNTER — Encounter: Payer: Self-pay | Admitting: Vascular Surgery

## 2016-08-17 ENCOUNTER — Other Ambulatory Visit: Payer: Self-pay | Admitting: *Deleted

## 2016-08-17 ENCOUNTER — Ambulatory Visit (HOSPITAL_COMMUNITY)
Admission: RE | Admit: 2016-08-17 | Discharge: 2016-08-17 | Disposition: A | Discharge status: Home / Self Care | Payer: Medicaid Other | Source: Ambulatory Visit | Attending: Vascular Surgery | Admitting: Vascular Surgery

## 2016-08-17 VITALS — BP 119/71 | HR 75 | Temp 97.8°F | Resp 18 | Ht 60.0 in | Wt 128.5 lb

## 2016-08-17 DIAGNOSIS — I83893 Varicose veins of bilateral lower extremities with other complications: Secondary | ICD-10-CM

## 2016-08-17 NOTE — Progress Notes (Signed)
Subjective:     Patient ID: Roberto Schultz, male   DOB: January 23, 1981, 36 y.o.   MRN: [id number]  HPI This 36 year old male returns for 3 month follow-up regarding his bilateral painful varicosities and distal edema due to gross reflux in bilateral great saphenous veins. He has aching throbbing and burning discomfort in both legs which has not been relieved by long leg elastic compression stockings, elevation, and ibuprofen. He does work on his feet all day in maintenance. He has no history of DVT or thrombophlebitis.  Past Medical History:  Diagnosis Date  . Allergic rhinitis due to pollen   . Down syndrome   . Heart disease, unspecified   . Leukocytopenia, unspecified   . Other acne   . Varicose veins     Social History  Substance Use Topics  . Smoking status: Never Smoker  . Smokeless tobacco: Never Used  . Alcohol use No    Family History  Problem Relation Age of Onset  . Cancer Father     ALS  . Other      noncontributory    No Known Allergies   Current Outpatient Prescriptions:  .  Black Currant Seed Oil 500 MG CAPS, Take 500 mg by mouth daily., Disp: , Rfl:  .  cetirizine (ZYRTEC) 10 MG chewable tablet, Chew 10 mg by mouth daily.  , Disp: , Rfl:  .  COD LIVER OIL PO, Take by mouth daily., Disp: , Rfl:  .  fluticasone (FLONASE) 50 MCG/ACT nasal spray, Place 2 sprays into the nose daily as needed for allergies or rhinitis. , Disp: , Rfl:  .  Multiple Vitamin (MULTIVITAMIN) tablet, Take 1 tablet by mouth daily.  , Disp: , Rfl:  .  SODIUM FLUORIDE PO, Take by mouth as directed., Disp: , Rfl:  .  thyroid (ARMOUR) 15 MG tablet, Take 15 mg by mouth daily., Disp: , Rfl:  .  thyroid (ARMOUR) 30 MG tablet, Take 30 mg by mouth daily before breakfast., Disp: , Rfl:  .  Turmeric 500 MG CAPS, Take by mouth., Disp: , Rfl:   Vitals:   08/17/16 1505  BP: 119/71  Pulse: 75  Resp: 18  Temp: 97.8 F (36.6 C)  TempSrc: Oral  SpO2: 99%  Weight: 128 lb 8 oz (58.3 kg)  Height:  5' (1.524 m)    Body mass index is 25.1 kg/m.         Review of Systems Denies chest pain, dyspnea on exertion, PND, orthopnea, hemoptysis    Objective:   Physical Exam BP 119/71 (BP Location: Right Arm, Patient Position: Sitting, Cuff Size: Normal)   Pulse 75   Temp 97.8 F (36.6 C) (Oral)   Resp 18   Ht 5' (1.524 m)   Wt 128 lb 8 oz (58.3 kg)   SpO2 99%   BMI 25.10 kg/m   Gen. well-developed well-nourished male with Down's syndrome. Accompanied by his father Roberto Schultz. Lungs no rhonchi or wheezing Both legs with extensive bulging varicosities primarily below the knee over the great saphenous system with large caliber great saphenous vein. Hyperpigmentation lower third both legs with no active ulceration. 3+ dorsalis pedis pulse palpable bilaterally.  Today I ordered bilateral venous reflux exam which revealed large caliber great saphenous veins bilaterally supplying these painful varicosities with no DVT     Assessment:     Bilateral painful varicosities due to gross reflux and large caliber bilateral great saphenous veins. Symptoms are affecting patient's daily living and ability to work and are  not relieved by long leg elastic compression stockings 20-30 millimeter gradient, elevation, and ibuprofen    Plan:     Patient needs #1 laser ablation right great saphenous vein plus greater than 20 stab phlebectomy followed by #2 laser ablation left great saphenous vein plus greater than 20 stab phlebectomy  We will proceed next week with the right side

## 2016-08-19 ENCOUNTER — Encounter: Payer: Self-pay | Admitting: Vascular Surgery

## 2016-08-23 ENCOUNTER — Encounter: Payer: Self-pay | Admitting: Vascular Surgery

## 2016-08-23 ENCOUNTER — Ambulatory Visit (INDEPENDENT_AMBULATORY_CARE_PROVIDER_SITE_OTHER): Payer: Medicare Other | Admitting: Vascular Surgery

## 2016-08-23 VITALS — BP 118/74 | HR 72 | Temp 97.6°F | Resp 16 | Ht 61.0 in | Wt 138.0 lb

## 2016-08-23 DIAGNOSIS — I83891 Varicose veins of right lower extremities with other complications: Secondary | ICD-10-CM

## 2016-08-23 DIAGNOSIS — I868 Varicose veins of other specified sites: Secondary | ICD-10-CM

## 2016-08-23 NOTE — Progress Notes (Signed)
Subjective:     Patient ID: Ailene Rudathaniel Back, male   DOB: 07/18/1981, 36 y.o.   MRN: [id number]  HPI This 36 year old male had laser ablation right great saphenous vein from the midcalf to near the saphenofemoral junction performed under local tumescent anesthesia plus greater than 20 stab phlebectomy of painful varicosities. A total of 2200 J of energy was utilized. He tolerated the procedures well.  Review of Systems     Objective:   Physical Exam BP 118/74   Pulse 72   Temp 97.6 F (36.4 C)   Resp 16   Ht 5\' 1"  (1.549 m)   Wt 138 lb (62.6 kg)   SpO2 99%   BMI 26.07 kg/m        Assessment:     Well-tolerated laser ablation right great saphenous vein plus greater than 20 stab phlebectomy of painful varicosities right leg performed under local tumescent anesthesia    Plan:       return in 1 week for venous duplex exam right leg to confirm closure and will then proceed with left lower extremity the following week with similar procedure

## 2016-08-23 NOTE — Progress Notes (Signed)
Laser Ablation Procedure    Date: 08/23/2016   Roberto Schultz DOB:[birth year]  Consent signed: Yes    Surgeon:  Dr. Quita SkyeJames D. Hart RochesterLawson  Procedure: Laser Ablation: right Greater Saphenous Vein  BP 118/74   Pulse 72   Temp 97.6 F (36.4 C)   Resp 16   Ht 5\' 1"  (1.549 m)   Wt 138 lb (62.6 kg)   SpO2 99%   BMI 26.07 kg/m   Tumescent Anesthesia: 400 cc 0.9% NaCl with 50 cc Lidocaine HCL with 1% Epi and 15 cc 8.4% NaHCO3  Local Anesthesia: 7 cc Lidocaine HCL and NaHCO3 (ratio 2:1)  Pulsed Mode: 15 watts, 500ms delay, 1.0 duration  Total Energy: 2255             Total Pulses:  151              Total Time: 2:30    Stab Phlebectomy: >20 Sites: Calf and Ankle  Patient tolerated procedure well  Notes:   Description of Procedure:  After marking the course of the secondary varicosities, the patient was placed on the operating table in the supine position, and the right leg was prepped and draped in sterile fashion.   Local anesthetic was administered and under ultrasound guidance the saphenous vein was accessed with a micro needle and guide wire; then the mirco puncture sheath was placed.  A guide wire was inserted saphenofemoral junction , followed by a 5 french sheath.  The position of the sheath and then the laser fiber below the junction was confirmed using the ultrasound.  Tumescent anesthesia was administered along the course of the saphenous vein using ultrasound guidance. The patient was placed in Trendelenburg position and protective laser glasses were placed on patient and staff, and the laser was fired at 15 watts continuous mode advancing 1-542mm/second for a total of 2255 joules.   For stab phlebectomies, local anesthetic was administered at the previously marked varicosities, and tumescent anesthesia was administered around the vessels.  Greater than 20 stab wounds were made using the tip of an 11 blade. And using the vein hook, the phlebectomies were performed using a hemostat  to avulse the varicosities.  Adequate hemostasis was achieved.   Sinda Du.  Steri strips were applied to the stab wounds and ABD pads and thigh high compression stockings were applied.  Ace wrap bandages were applied over the phlebectomy sites and at the top of the saphenofemoral junction. Blood loss was less than 15 cc.  The patient ambulated out of the operating room having tolerated the procedure well.

## 2016-08-24 ENCOUNTER — Encounter: Payer: Self-pay | Admitting: Vascular Surgery

## 2016-08-30 ENCOUNTER — Encounter: Payer: Self-pay | Admitting: Vascular Surgery

## 2016-08-30 ENCOUNTER — Ambulatory Visit (INDEPENDENT_AMBULATORY_CARE_PROVIDER_SITE_OTHER): Payer: Self-pay | Admitting: Vascular Surgery

## 2016-08-30 ENCOUNTER — Ambulatory Visit (HOSPITAL_COMMUNITY)
Admission: RE | Admit: 2016-08-30 | Discharge: 2016-08-30 | Disposition: A | Discharge status: Home / Self Care | Payer: Medicaid Other | Source: Ambulatory Visit | Attending: Vascular Surgery | Admitting: Vascular Surgery

## 2016-08-30 VITALS — BP 116/71 | HR 77 | Temp 97.4°F | Resp 16

## 2016-08-30 DIAGNOSIS — I83893 Varicose veins of bilateral lower extremities with other complications: Secondary | ICD-10-CM

## 2016-08-30 NOTE — Progress Notes (Signed)
Subjective:     Patient ID: Roberto Schultz, male   DOB: November 12, 1980, 36 y.o.   MRN: [id number]  HPI This 36 year old male returns 1 week post-laser ablation right great saphenous vein plus multiple stab phlebectomy of painful varicosities. He has had mild discomfort. He has taken ibuprofen as instructed. He denies any chest pain, dyspnea on exertion, PND, orthopnea, hemoptysis.  Review of Systems     Objective:   Physical Exam BP 116/71 (BP Location: Left Arm, Patient Position: Sitting, Cuff Size: Normal)   Pulse 77   Temp 97.4 F (36.3 C)   Resp 16   SpO2 100%   Gen. well-developed well-nourished male no apparent distress alert and oriented 3 Right leg with minimal distal edema. Mild discomfort to deep palpation over great saphenous vein. Stab phlebectomy sites all healing nicely with Steri-Strips in place. 2+ dorsalis pedis pulse palpable.  Today I ordered a venous duplex exam of the right leg which I reviewed and interpreted. There is no DVT. There is total closure of the right great saphenous vein up to near the saphenofemoral junction     Assessment:     Successful laser ablation right great saphenous vein plus multiple stab phlebectomy of painful varicosities    Plan:     Return next week for similar procedure on contralateral left leg

## 2016-08-31 ENCOUNTER — Encounter: Payer: Self-pay | Admitting: Vascular Surgery

## 2016-09-06 ENCOUNTER — Encounter: Payer: Self-pay | Admitting: Vascular Surgery

## 2016-09-06 ENCOUNTER — Ambulatory Visit (INDEPENDENT_AMBULATORY_CARE_PROVIDER_SITE_OTHER): Payer: Medicare Other | Admitting: Vascular Surgery

## 2016-09-06 VITALS — BP 120/68 | HR 80 | Temp 97.6°F | Resp 16 | Ht 61.0 in | Wt 135.0 lb

## 2016-09-06 DIAGNOSIS — I83892 Varicose veins of left lower extremities with other complications: Secondary | ICD-10-CM

## 2016-09-06 NOTE — Progress Notes (Signed)
Subjective:     Patient ID: Roberto Schultz, male   DOB: 1981/07/08, 36 y.o.   MRN: [id number]  HPI This 36 year old male had laser ablation left great saphenous vein from the midcalf to near the saphenofemoral junction performed under local tumescent anesthesia. A total of 2509 J of energy was utilized. He also had 10-20 stab phlebectomy of painful varicosities. He tolerated both procedures well.  Review of Systems     Objective:   Physical Exam BP 120/68   Pulse 80   Temp 97.6 F (36.4 C)   Resp 16   Ht 5\' 1"  (1.549 m)   Wt 135 lb (61.2 kg)   SpO2 100%   BMI 25.51 kg/m       Assessment:     Well-tolerated laser ablation left great saphenous vein +10-20 stab stab phlebectomy of painful varicosities performed under local tumescent anesthesia    Plan:     Return in 1 week for venous duplex exam to confirm closure left great saphenous vein and this will complete patient's treatment regimen

## 2016-09-06 NOTE — Progress Notes (Signed)
Laser Ablation Procedure    Date: 09/06/2016   Roberto Schultz DOB:1981-03-14  Consent signed: Yes    Surgeon:  Dr. Quita SkyeJames D. Hart RochesterLawson  Procedure: Laser Ablation: left Greater Saphenous Vein  BP 120/68   Pulse 80   Temp 97.6 F (36.4 C)   Resp 16   Ht 5\' 1"  (1.549 m)   Wt 135 lb (61.2 kg)   SpO2 100%   BMI 25.51 kg/m   Tumescent Anesthesia: 410 cc 0.9% NaCl with 50 cc Lidocaine HCL with 1% Epi and 15 cc 8.4% NaHCO3  Local Anesthesia: 7 cc Lidocaine HCL and NaHCO3 (ratio 2:1)  Pulsed Mode: 15 watts, 500ms delay, 1.0 duration  Total Energy:2504              Total Pulses:  168              Total Time: 2:47    Stab Phlebectomy: 10-20 Sites: Calf and Ankle  Patient tolerated procedure well  Notes:   Description of Procedure:  After marking the course of the secondary varicosities, the patient was placed on the operating table in the supine position, and the left leg was prepped and draped in sterile fashion.   Local anesthetic was administered and under ultrasound guidance the saphenous vein was accessed with a micro needle and guide wire; then the mirco puncture sheath was placed.  A guide wire was inserted saphenofemoral junction , followed by a 5 french sheath.  The position of the sheath and then the laser fiber below the junction was confirmed using the ultrasound.  Tumescent anesthesia was administered along the course of the saphenous vein using ultrasound guidance. The patient was placed in Trendelenburg position and protective laser glasses were placed on patient and staff, and the laser was fired at 15 watts continuous mode advancing 1-302mm/second for a total of 2504 joules.   For stab phlebectomies, local anesthetic was administered at the previously marked varicosities, and tumescent anesthesia was administered around the vessels.  Ten to 20 stab wounds were made using the tip of an 11 blade. And using the vein hook, the phlebectomies were performed using a hemostat to  avulse the varicosities.  Adequate hemostasis was achieved.   Sinda Du.  Steri strips were applied to the stab wounds and ABD pads and thigh high compression stockings were applied.  Ace wrap bandages were applied over the phlebectomy sites and at the top of the saphenofemoral junction. Blood loss was less than 15 cc.  The patient ambulated out of the operating room having tolerated the procedure well.

## 2016-09-07 ENCOUNTER — Encounter: Payer: Self-pay | Admitting: Vascular Surgery

## 2016-09-10 ENCOUNTER — Telehealth: Payer: Self-pay | Admitting: *Deleted

## 2016-09-10 NOTE — Telephone Encounter (Signed)
Pt's mother called in saying he is having more discomfort with this leg than the previously treated leg. I called and left a vm saying every leg can be different. The symptoms she described are wnl and she should make sure he is using compression, taking Ibuprofen, walking and elevating when he's not up. Also suggested heat over the treated vein. His fu is Monday. Will follow prn.

## 2016-09-13 ENCOUNTER — Encounter: Payer: Self-pay | Admitting: Vascular Surgery

## 2016-09-13 ENCOUNTER — Ambulatory Visit (INDEPENDENT_AMBULATORY_CARE_PROVIDER_SITE_OTHER): Payer: Self-pay | Admitting: Vascular Surgery

## 2016-09-13 ENCOUNTER — Ambulatory Visit (HOSPITAL_COMMUNITY)
Admission: RE | Admit: 2016-09-13 | Discharge: 2016-09-13 | Disposition: A | Discharge status: Home / Self Care | Payer: Medicaid Other | Source: Ambulatory Visit | Attending: Vascular Surgery | Admitting: Vascular Surgery

## 2016-09-13 VITALS — BP 128/85 | HR 83 | Temp 98.0°F | Resp 16 | Ht 61.0 in | Wt 135.0 lb

## 2016-09-13 DIAGNOSIS — I83893 Varicose veins of bilateral lower extremities with other complications: Secondary | ICD-10-CM | POA: Diagnosis present

## 2016-09-13 DIAGNOSIS — I83892 Varicose veins of left lower extremities with other complications: Secondary | ICD-10-CM

## 2016-09-13 NOTE — Progress Notes (Signed)
Subjective:     Patient ID: Roberto Schultz, male   DOB: 05/08/1981, 36 y.o.   MRN: [id number]  HPI This 36 year old male with Down's syndrome returns 1 week post-laser ablation left great saphenous vein plus multiple stab phlebectomy of painful varicosities. He had some mild discomfort in the left medial thigh early in the week but that has now resolved. He did take his ibuprofen as instructed. He has worn his elastic compression stocking. He has no specific complaints. He's had no chest pain dyspnea on exertion PND orthopnea or hemoptysis.  Review of Systems     Objective:   Physical Exam BP 128/85   Pulse 83   Temp 98 F (36.7 C)   Resp 16   Ht 5\' 1"  (1.549 m)   Wt 135 lb (61.2 kg)   SpO2 98%   BMI 25.51 kg/m   Gen. well-developed well-nourished male no apparent distress alert and oriented 3 Left leg with nicely healing stab phlebectomy sites lower leg and trace distal edema. Mild discomfort to deep palpation over great saphenous vein down to proximal calf. Today I ordered a venous duplex exam the left leg which I reviewed and interpreted. There is no DVT. There is total closure of the left great saphenous vein up to near the saphenofemoral junction.     Assessment:     Successful bilateral laser ablation of great saphenous veins plus multiple stab phlebectomy bilaterally of painful varicosities-good result    Plan:     Return to see me on a when necessary basis

## 2016-09-14 DIAGNOSIS — Z23 Encounter for immunization: Secondary | ICD-10-CM | POA: Diagnosis not present

## 2016-09-14 DIAGNOSIS — Q909 Down syndrome, unspecified: Secondary | ICD-10-CM | POA: Diagnosis not present

## 2016-09-14 DIAGNOSIS — Z13220 Encounter for screening for lipoid disorders: Secondary | ICD-10-CM | POA: Diagnosis not present

## 2016-09-14 DIAGNOSIS — Z0000 Encounter for general adult medical examination without abnormal findings: Secondary | ICD-10-CM | POA: Diagnosis not present

## 2016-09-14 DIAGNOSIS — D72819 Decreased white blood cell count, unspecified: Secondary | ICD-10-CM | POA: Diagnosis not present

## 2016-09-14 DIAGNOSIS — E039 Hypothyroidism, unspecified: Secondary | ICD-10-CM | POA: Diagnosis not present

## 2016-09-24 NOTE — Progress Notes (Signed)
Patient ID: Roberto Schultz, male   DOB: 07-Apr-1981, 36 y.o.   MRN: [id number] 36 y.o. with Downs Syndrome living well at group home. 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 Normal EF Last echo 9/11 reviewed. No dyspnea, palpitations, SSCP, edema. No longer playing basketball. Seeing dentist at least twice a year. His dad was with him today. Older sister doing well and graduated with job at [employer]. Middle son not so well hanging around "rappers" and has an unwed child he is not supporting. 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 No evidence of progressive heart block   Has had some leukopenia ? Related to antibiotics  Seeing Murinson  Living in new group home more age specific Roberto Schultz Dad indicates he is doing well   Has some varicosities especially LLE Has seen VVS and had laser RX   ROS: Denies fever, malais, weight loss, blurry vision, decreased visual acuity, cough, sputum, SOB, hemoptysis, pleuritic pain, palpitaitons, heartburn, abdominal pain, melena, lower extremity edema, claudication, or rash.  All other systems reviewed and negative  General: Affect appropriate Down Syndrome Facies  HEENT: normal Neck supple with no adenopathy JVP normal no bruits no thyromegaly Lungs clear with no wheezing and good diaphragmatic motion Heart:  S1/S2  Apical  murmur, no rub, gallop or click PMI normal Abdomen: benighn, BS positve, no tenderness, no AAA no bruit.  No HSM or HJR Distal pulses intact with no bruits No edema Neuro non-focal Skin warm and dry No muscular weakness   Current Outpatient Prescriptions  Medication Sig Dispense Refill  . Black Currant Seed Oil 500 MG CAPS Take 500 mg by mouth daily.    . cetirizine (ZYRTEC) 10 MG chewable tablet Chew 10 mg by mouth daily.      . COD LIVER OIL PO Take 1 tablet by mouth daily.     . Multiple Vitamin (MULTIVITAMIN) tablet Take 1 tablet by mouth daily.      . Olopatadine HCl (PATANASE) 0.6 % SOLN Place 2 puffs into the nose daily as  needed (Allergies).    . SODIUM FLUORIDE PO Take 1 application by mouth daily.     Marland Kitchen. thyroid (ARMOUR) 15 MG tablet Take 15 mg by mouth daily.    Marland Kitchen. thyroid (ARMOUR) 30 MG tablet Take 30 mg by mouth daily before breakfast.    . Turmeric 500 MG CAPS Take 1 tablet by mouth daily.      No current facility-administered medications for this visit.     Allergies  Soap  Electrocardiogram:  04/12/12  SR rate 49  RBBB  05/04/13 SB rate 57 LAD RBBB   10/02/15  SR rate 58  RBBB LAD no change  10/05/16  SR rate 58 RBBB LAFB no change   Assessment and Plan  Congenital heart Disease:  Post AV canal repair with mild AR/MR on echo 2013 Allergies:  On syrtec and flonase Thyroid:  On natural armour replacement RBBB:  Related to AV canal defect and repair stable over the years f/u ECG in a year  Varicose Veins:  F/u VVS post ablative laser Rx LLE   Roberto Schultz Roberto Schultz

## 2016-10-05 ENCOUNTER — Encounter: Payer: Self-pay | Admitting: Cardiovascular Disease

## 2016-10-05 ENCOUNTER — Ambulatory Visit (INDEPENDENT_AMBULATORY_CARE_PROVIDER_SITE_OTHER): Payer: Medicare Other | Admitting: Cardiovascular Disease

## 2016-10-05 VITALS — BP 108/70 | HR 67 | Ht 62.0 in | Wt 133.4 lb

## 2016-10-05 DIAGNOSIS — I340 Nonrheumatic mitral (valve) insufficiency: Secondary | ICD-10-CM | POA: Diagnosis not present

## 2016-10-05 NOTE — Patient Instructions (Signed)

## 2017-03-22 DIAGNOSIS — D72819 Decreased white blood cell count, unspecified: Secondary | ICD-10-CM | POA: Diagnosis not present

## 2017-03-22 DIAGNOSIS — Q909 Down syndrome, unspecified: Secondary | ICD-10-CM | POA: Diagnosis not present

## 2017-03-22 DIAGNOSIS — E039 Hypothyroidism, unspecified: Secondary | ICD-10-CM | POA: Diagnosis not present

## 2017-04-30 DIAGNOSIS — L309 Dermatitis, unspecified: Secondary | ICD-10-CM | POA: Diagnosis not present

## 2017-05-24 DIAGNOSIS — E039 Hypothyroidism, unspecified: Secondary | ICD-10-CM | POA: Diagnosis not present

## 2017-06-21 DIAGNOSIS — Z23 Encounter for immunization: Secondary | ICD-10-CM | POA: Diagnosis not present

## 2017-09-27 DIAGNOSIS — E039 Hypothyroidism, unspecified: Secondary | ICD-10-CM | POA: Diagnosis not present

## 2017-09-27 DIAGNOSIS — M1610 Unilateral primary osteoarthritis, unspecified hip: Secondary | ICD-10-CM | POA: Diagnosis not present

## 2017-09-27 DIAGNOSIS — Z13220 Encounter for screening for lipoid disorders: Secondary | ICD-10-CM | POA: Diagnosis not present

## 2017-09-27 DIAGNOSIS — Z0001 Encounter for general adult medical examination with abnormal findings: Secondary | ICD-10-CM | POA: Diagnosis not present

## 2017-09-27 DIAGNOSIS — D72819 Decreased white blood cell count, unspecified: Secondary | ICD-10-CM | POA: Diagnosis not present

## 2017-09-27 DIAGNOSIS — Q909 Down syndrome, unspecified: Secondary | ICD-10-CM | POA: Diagnosis not present

## 2017-09-27 DIAGNOSIS — Z79899 Other long term (current) drug therapy: Secondary | ICD-10-CM | POA: Diagnosis not present

## 2017-10-06 ENCOUNTER — Ambulatory Visit: Payer: Medicare Other | Admitting: Cardiovascular Disease

## 2017-10-06 NOTE — Progress Notes (Signed)
Patient ID: Roberto Schultz, male   DOB: 1981/06/26, 37 y.o.   MRN: [id number] 37 y.o. with Downs Syndrome living well at group home. 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 Normal EF Last echo 9/11 reviewed. No dyspnea, palpitations, SSCP, edema. No longer playing basketball. Seeing dentist at least twice a year. His dad was with him today. Older sister doing well and graduated with job at [employer]. Middle son not so well hanging around "rappers" and has an unwed child he is not supporting. 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 No evidence of progressive heart block   Has had some leukopenia ? 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  All other systems reviewed and negative  General: BP 122/72   Pulse 65   Ht 5\' 2"  (1.575 m)   Wt 134 lb 12 oz (61.1 kg)   SpO2 97%   BMI 24.65 kg/m  Affect appropriate Healthy:  appears stated age HEENT: normal Neck supple with no adenopathy JVP normal no bruits no thyromegaly Lungs clear with no wheezing and good diaphragmatic motion Heart:  S1/S2 apical  murmur, no rub, gallop or click PMI normal Abdomen: benighn, BS positve, no tenderness, no AAA no bruit.  No HSM or HJR Distal pulses intact with no bruits No edema Neuro non-focal Skin warm and dry No muscular weakness    Current Outpatient Medications  Medication Sig Dispense Refill  . cetirizine (ZYRTEC) 10 MG chewable tablet Chew 10 mg by mouth daily.      . Multiple Vitamin (MULTIVITAMIN) tablet Take 1 tablet by mouth  daily.      . Olopatadine HCl (PATANASE) 0.6 % SOLN Place 2 puffs into the nose daily as needed (Allergies).    . SODIUM FLUORIDE PO Take 1 application by mouth daily.     Marland Kitchen. thyroid (ARMOUR) 15 MG tablet Take 15 mg by mouth daily.    Marland Kitchen. thyroid (ARMOUR) 30 MG tablet Take 30 mg by mouth daily before breakfast.     No current facility-administered medications for this visit.     Allergies  Soap  Electrocardiogram:  04/12/12  SR rate 49  RBBB  05/04/13 SB rate 57 LAD RBBB   10/02/15  10/11/17 SR rate 61 RBBB   Assessment and Plan  Congenital heart Disease:  Post AV canal repair with mild AR/MR on echo 2013 Will update echo   Allergies:  On syrtec and flonase  Thyroid:  On natural armour replacement labs with primary   RBBB:  Related to AV canal defect and repair stable over the years f/u ECG in a year   Varicose Veins:  F/u VVS post ablative laser Rx LLE   Charlton HawsPeter Nishan

## 2017-10-11 ENCOUNTER — Ambulatory Visit (INDEPENDENT_AMBULATORY_CARE_PROVIDER_SITE_OTHER): Payer: Medicare Other | Admitting: Cardiovascular Disease

## 2017-10-11 ENCOUNTER — Encounter: Payer: Self-pay | Admitting: Cardiovascular Disease

## 2017-10-11 VITALS — BP 122/72 | HR 65 | Ht 62.0 in | Wt 134.8 lb

## 2017-10-11 DIAGNOSIS — Q249 Congenital malformation of heart, unspecified: Secondary | ICD-10-CM | POA: Diagnosis not present

## 2017-10-11 DIAGNOSIS — I83893 Varicose veins of bilateral lower extremities with other complications: Secondary | ICD-10-CM | POA: Diagnosis not present

## 2017-10-11 DIAGNOSIS — I340 Nonrheumatic mitral (valve) insufficiency: Secondary | ICD-10-CM | POA: Diagnosis not present

## 2017-10-11 DIAGNOSIS — I4510 Unspecified right bundle-branch block: Secondary | ICD-10-CM | POA: Diagnosis not present

## 2017-10-11 NOTE — Patient Instructions (Addendum)

## 2017-10-18 DIAGNOSIS — R7301 Impaired fasting glucose: Secondary | ICD-10-CM | POA: Diagnosis not present

## 2018-03-28 DIAGNOSIS — E039 Hypothyroidism, unspecified: Secondary | ICD-10-CM | POA: Diagnosis not present

## 2018-03-28 DIAGNOSIS — D72819 Decreased white blood cell count, unspecified: Secondary | ICD-10-CM | POA: Diagnosis not present

## 2018-03-28 DIAGNOSIS — Q909 Down syndrome, unspecified: Secondary | ICD-10-CM | POA: Diagnosis not present

## 2018-05-21 DIAGNOSIS — Z23 Encounter for immunization: Secondary | ICD-10-CM | POA: Diagnosis not present

## 2018-06-22 DIAGNOSIS — M62830 Muscle spasm of back: Secondary | ICD-10-CM | POA: Diagnosis not present

## 2018-06-29 ENCOUNTER — Ambulatory Visit
Admission: RE | Admit: 2018-06-29 | Discharge: 2018-06-29 | Disposition: A | Discharge status: Home / Self Care | Payer: Medicare Other | Source: Ambulatory Visit | Attending: Family Medicine | Admitting: Family Medicine

## 2018-06-29 ENCOUNTER — Other Ambulatory Visit: Payer: Self-pay | Admitting: Family Medicine

## 2018-06-29 DIAGNOSIS — M545 Low back pain, unspecified: Secondary | ICD-10-CM

## 2018-06-29 DIAGNOSIS — J181 Lobar pneumonia, unspecified organism: Secondary | ICD-10-CM | POA: Diagnosis not present

## 2018-06-29 DIAGNOSIS — M25552 Pain in left hip: Secondary | ICD-10-CM | POA: Diagnosis not present

## 2018-06-29 DIAGNOSIS — R918 Other nonspecific abnormal finding of lung field: Secondary | ICD-10-CM

## 2018-06-29 DIAGNOSIS — M4186 Other forms of scoliosis, lumbar region: Secondary | ICD-10-CM | POA: Diagnosis not present

## 2018-07-14 ENCOUNTER — Other Ambulatory Visit: Payer: Self-pay | Admitting: Family Medicine

## 2018-07-14 ENCOUNTER — Ambulatory Visit
Admission: RE | Admit: 2018-07-14 | Discharge: 2018-07-14 | Disposition: A | Discharge status: Home / Self Care | Payer: Medicare Other | Source: Ambulatory Visit | Attending: Family Medicine | Admitting: Family Medicine

## 2018-07-14 DIAGNOSIS — J181 Lobar pneumonia, unspecified organism: Principal | ICD-10-CM

## 2018-07-14 DIAGNOSIS — J189 Pneumonia, unspecified organism: Secondary | ICD-10-CM

## 2018-09-19 DIAGNOSIS — M1610 Unilateral primary osteoarthritis, unspecified hip: Secondary | ICD-10-CM | POA: Diagnosis not present

## 2018-09-19 DIAGNOSIS — Q909 Down syndrome, unspecified: Secondary | ICD-10-CM | POA: Diagnosis not present

## 2018-09-19 DIAGNOSIS — Z0001 Encounter for general adult medical examination with abnormal findings: Secondary | ICD-10-CM | POA: Diagnosis not present

## 2018-09-19 DIAGNOSIS — R7301 Impaired fasting glucose: Secondary | ICD-10-CM | POA: Diagnosis not present

## 2018-09-19 DIAGNOSIS — D72819 Decreased white blood cell count, unspecified: Secondary | ICD-10-CM | POA: Diagnosis not present

## 2018-09-19 DIAGNOSIS — E039 Hypothyroidism, unspecified: Secondary | ICD-10-CM | POA: Diagnosis not present

## 2018-09-19 DIAGNOSIS — Z79899 Other long term (current) drug therapy: Secondary | ICD-10-CM | POA: Diagnosis not present

## 2018-10-12 ENCOUNTER — Ambulatory Visit: Payer: Medicare Other | Admitting: Cardiovascular Disease

## 2018-11-03 ENCOUNTER — Encounter: Payer: Self-pay | Admitting: Cardiovascular Disease

## 2018-11-06 ENCOUNTER — Telehealth: Payer: Self-pay | Admitting: Cardiovascular Disease

## 2018-11-06 NOTE — Telephone Encounter (Signed)
Pam call patient cancel appointment level 3 acuity can see in a year

## 2018-11-06 NOTE — Telephone Encounter (Signed)
Canceled patient's appointment. Recall placed for 1 year.

## 2018-11-14 ENCOUNTER — Ambulatory Visit: Payer: Medicare Other | Admitting: Cardiovascular Disease

## 2019-01-18 IMAGING — CR DG CHEST 2V
2 series · 2 of 2 positions shown · non-contrast
Comparison: 06/29/2018 lumbar spine radiographs

CLINICAL DATA: Abnormal lumbar spine radiographs with questionable
basilar infiltrate

EXAM:
CHEST - 2 VIEW

[w chest lat]
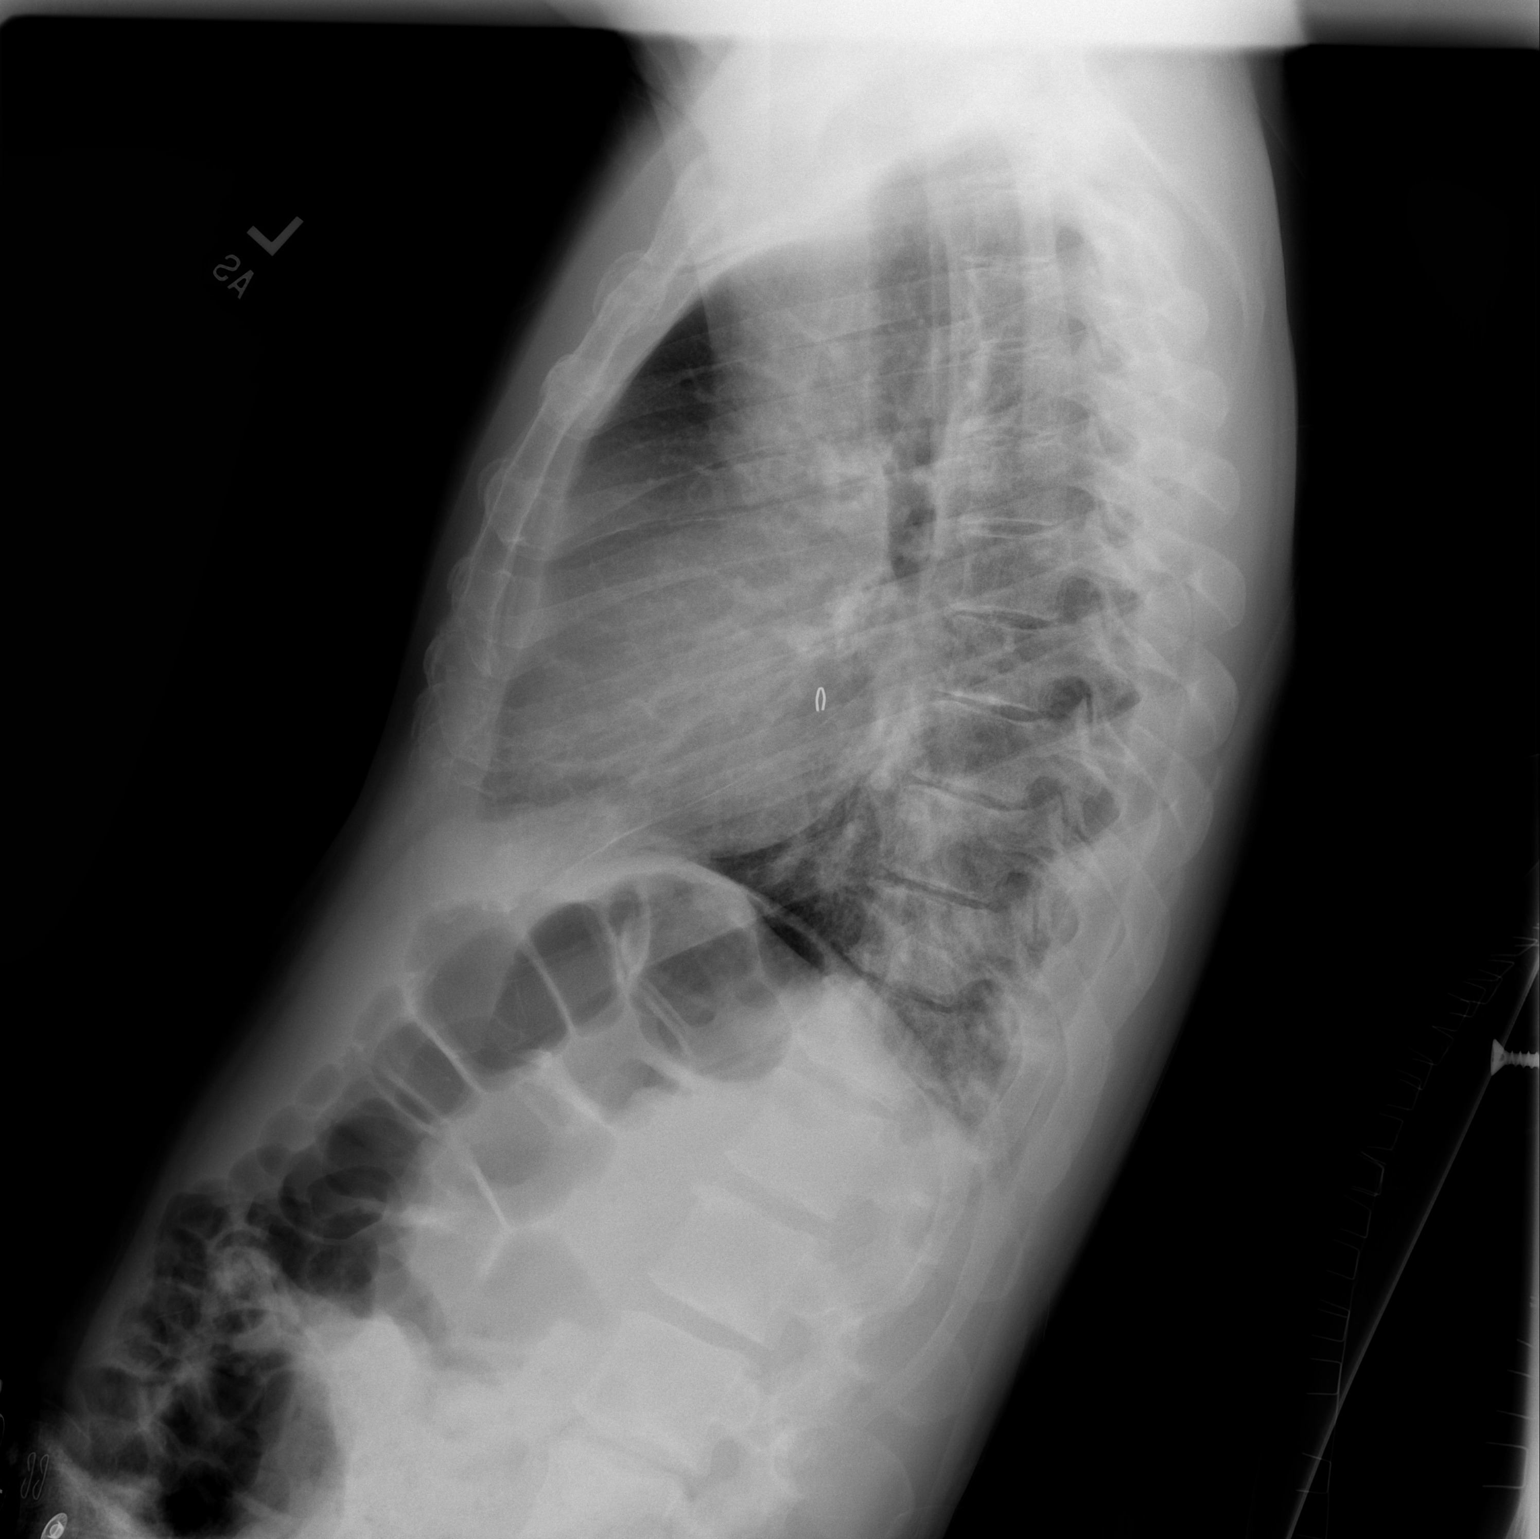

[w chest ap *]
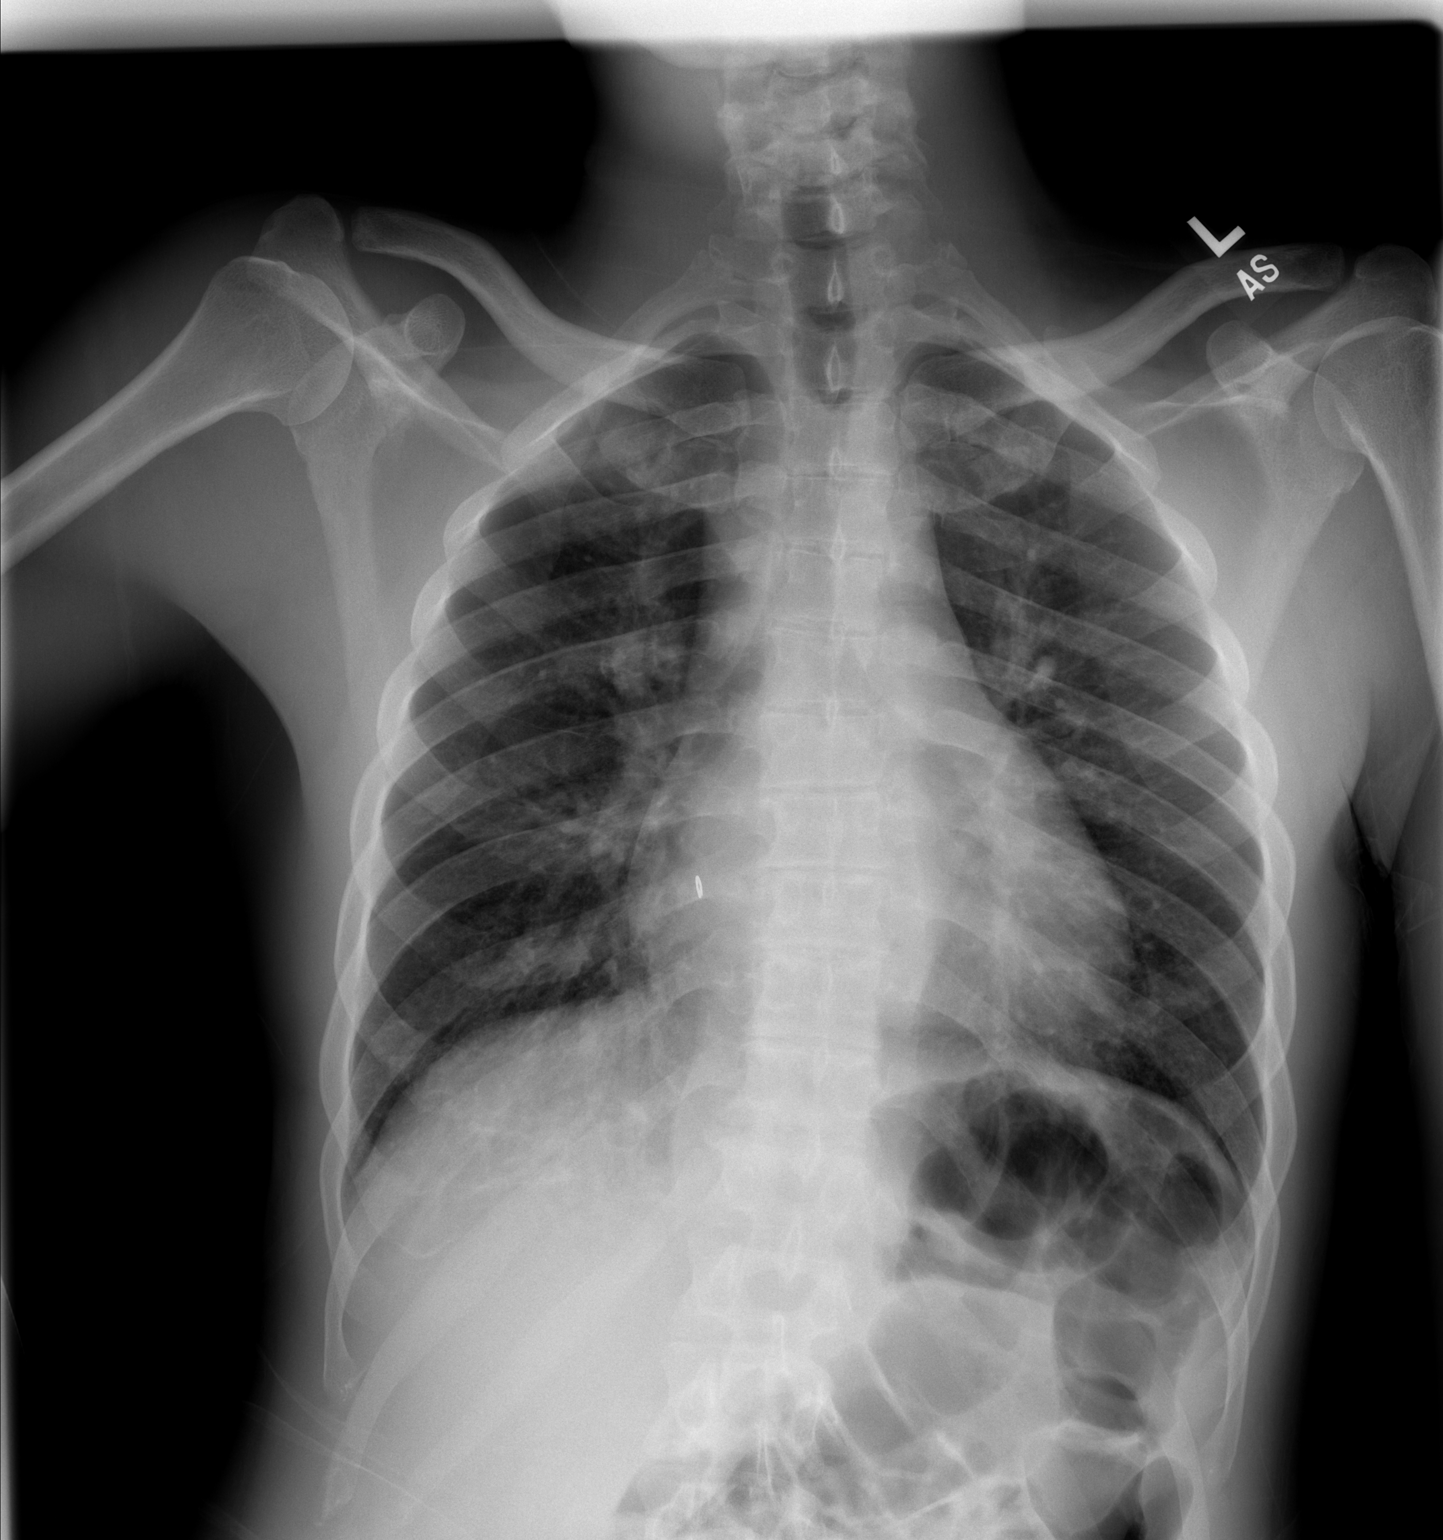

[2 of 2 positions shown; findings below may reference images not displayed]

FINDINGS: Upper normal size of cardiac silhouette.

Mediastinal contours and pulmonary vascularity normal.

RIGHT lower lobe consolidation consistent with pneumonia.

Remaining lungs clear.

No pleural effusion or pneumothorax.

Bones unremarkable.
IMPRESSION: RIGHT lower lobe pneumonia.

## 2019-03-29 DIAGNOSIS — D72819 Decreased white blood cell count, unspecified: Secondary | ICD-10-CM | POA: Diagnosis not present

## 2019-03-29 DIAGNOSIS — M25432 Effusion, left wrist: Secondary | ICD-10-CM | POA: Diagnosis not present

## 2019-03-29 DIAGNOSIS — Q909 Down syndrome, unspecified: Secondary | ICD-10-CM | POA: Diagnosis not present

## 2019-03-29 DIAGNOSIS — E039 Hypothyroidism, unspecified: Secondary | ICD-10-CM | POA: Diagnosis not present

## 2019-04-12 DIAGNOSIS — Z23 Encounter for immunization: Secondary | ICD-10-CM | POA: Diagnosis not present

## 2019-04-12 DIAGNOSIS — M25511 Pain in right shoulder: Secondary | ICD-10-CM | POA: Diagnosis not present

## 2019-04-13 ENCOUNTER — Other Ambulatory Visit: Payer: Self-pay | Admitting: Family Medicine

## 2019-04-13 ENCOUNTER — Other Ambulatory Visit: Payer: Self-pay

## 2019-04-13 ENCOUNTER — Ambulatory Visit
Admission: RE | Admit: 2019-04-13 | Discharge: 2019-04-13 | Disposition: A | Discharge status: Home / Self Care | Payer: Medicare Other | Source: Ambulatory Visit | Attending: Family Medicine | Admitting: Family Medicine

## 2019-04-13 DIAGNOSIS — M25511 Pain in right shoulder: Secondary | ICD-10-CM

## 2019-06-06 DIAGNOSIS — E039 Hypothyroidism, unspecified: Secondary | ICD-10-CM | POA: Diagnosis not present

## 2019-08-15 DIAGNOSIS — E039 Hypothyroidism, unspecified: Secondary | ICD-10-CM | POA: Diagnosis not present

## 2019-10-02 DIAGNOSIS — Z136 Encounter for screening for cardiovascular disorders: Secondary | ICD-10-CM | POA: Diagnosis not present

## 2019-10-02 DIAGNOSIS — Z0001 Encounter for general adult medical examination with abnormal findings: Secondary | ICD-10-CM | POA: Diagnosis not present

## 2019-10-02 DIAGNOSIS — D72819 Decreased white blood cell count, unspecified: Secondary | ICD-10-CM | POA: Diagnosis not present

## 2019-10-02 DIAGNOSIS — E039 Hypothyroidism, unspecified: Secondary | ICD-10-CM | POA: Diagnosis not present

## 2019-10-02 DIAGNOSIS — Q909 Down syndrome, unspecified: Secondary | ICD-10-CM | POA: Diagnosis not present

## 2020-01-29 NOTE — Progress Notes (Signed)
Patient ID: Roberto Schultz, male   DOB: 10-Aug-1980, 39 y.o.   MRN: [id number]     39 y.o. with Downs Syndrome living well at group home. 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 Normal EF Last echo 9/11 reviewed. No dyspnea, palpitations, SSCP, edema. No longer playing basketball. Seeing dentist at least twice a year. His dad was with him today. Older sister doing well and graduated with job at [employer]. Middle son not so well hanging around "rappers" and has an unwed child he is not supporting. 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 No evidence of progressive heart block   Has had some leukopenia ? 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  All other systems reviewed and negative  General: BP 114/82   Pulse (!) 53   Ht 5\' 5"  (1.651 m)   Wt 138 lb (62.6 kg)   SpO2 99%   BMI 22.96 kg/m  Affect appropriate Healthy:  appears stated age HEENT: normal Neck supple with no adenopathy JVP normal no bruits no thyromegaly Lungs clear with no wheezing and good diaphragmatic motion Heart:  S1/S2 apical  murmur, no rub, gallop or click PMI normal Abdomen: benighn, BS positve, no tenderness, no AAA no bruit.  No HSM or HJR Distal pulses intact with no bruits No edema Neuro non-focal Skin warm and dry No muscular weakness    Current Outpatient Medications  Medication Sig Dispense Refill  . amoxicillin (AMOXIL) 500 MG capsule Take 4 capsules by mouth. 1 hour before dental appointments    . cetirizine (ZYRTEC) 10 MG chewable tablet Chew 10 mg by mouth daily.      .  Multiple Vitamin (MULTIVITAMIN) tablet Take 1 tablet by mouth daily.      . Olopatadine HCl (PATANASE) 0.6 % SOLN Place 2 puffs into the nose daily as needed (Allergies).    . SODIUM FLUORIDE PO Take 1 application by mouth daily.     thyroid (ARMOUR) 15 MG tablet Take 15 mg by mouth daily.    Marland Kitchen thyroid (ARMOUR) 30 MG tablet Take 30 mg by mouth daily before breakfast.     No current facility-administered medications for this visit.    Allergies  Soap  Electrocardiogram:  01/31/20 SR RBBB old nonspecific ST changes   Assessment and Plan  Congenital heart Disease:  Post AV canal repair with mild AR/MR on echo 2013 Will update echo   Allergies:  On syrtec and flonase  Thyroid:  On natural armour replacement labs with primary   RBBB:  Related to AV canal defect and repair stable over the years f/u ECG in a year   Varicose Veins:  F/u VVS post ablative laser Rx LLE   Echo for congenital heart disease F/U in a year if stable  2014

## 2020-01-31 ENCOUNTER — Encounter: Payer: Self-pay | Admitting: Cardiovascular Disease

## 2020-01-31 ENCOUNTER — Ambulatory Visit (INDEPENDENT_AMBULATORY_CARE_PROVIDER_SITE_OTHER): Payer: Medicare HMO | Admitting: Cardiovascular Disease

## 2020-01-31 ENCOUNTER — Other Ambulatory Visit: Payer: Self-pay

## 2020-01-31 VITALS — BP 114/82 | HR 53 | Ht 65.0 in | Wt 138.0 lb

## 2020-01-31 DIAGNOSIS — I351 Nonrheumatic aortic (valve) insufficiency: Secondary | ICD-10-CM

## 2020-01-31 DIAGNOSIS — Q249 Congenital malformation of heart, unspecified: Secondary | ICD-10-CM

## 2020-01-31 DIAGNOSIS — I340 Nonrheumatic mitral (valve) insufficiency: Secondary | ICD-10-CM

## 2020-01-31 NOTE — Patient Instructions (Signed)
Medication Instructions:  *If you need a refill on your cardiac medications before your next appointment, please call your pharmacy*  Lab Work: If you have labs (blood work) drawn today and your tests are completely normal, you will receive your results only by: . MyChart Message (if you have MyChart) OR . A paper copy in the mail If you have any lab test that is abnormal or we need to change your treatment, we will call you to review the results.  Testing/Procedures: Your physician has requested that you have an echocardiogram. Echocardiography is a painless test that uses sound waves to create images of your heart. It provides your doctor with information about the size and shape of your heart and how well your heart's chambers and valves are working. This procedure takes approximately one hour. There are no restrictions for this procedure.  Follow-Up: At [facility], you and your health needs are our priority.  As part of our continuing mission to provide you with exceptional heart care, we have created designated Provider Care Teams.  These Care Teams include your primary Cardiologist (physician) and Advanced Practice Providers (APPs -  Physician Assistants and Nurse Practitioners) who all work together to provide you with the care you need, when you need it.  We recommend signing up for the patient portal called "MyChart".  Sign up information is provided on this After Visit Summary.  MyChart is used to connect with patients for Virtual Visits (Telemedicine).  Patients are able to view lab/test results, encounter notes, upcoming appointments, etc.  Non-urgent messages can be sent to your provider as well.   To learn more about what you can do with MyChart, go to [email or web address].    Your next appointment:   1 year(s)  The format for your next appointment:   In Person  Provider:   You may see Dr. Nishan or one of the following Advanced Practice Providers on your designated Care  Team:    Lori Gerhardt, NP  Laura Ingold, NP  Jill McDaniel, NP   

## 2020-02-25 ENCOUNTER — Ambulatory Visit (HOSPITAL_COMMUNITY)
Admission: RE | Admit: 2020-02-25 | Discharge: 2020-02-25 | Disposition: A | Discharge status: Home / Self Care | Payer: Medicare HMO | Source: Ambulatory Visit | Attending: Cardiovascular Disease | Admitting: Cardiovascular Disease

## 2020-02-25 ENCOUNTER — Other Ambulatory Visit: Payer: Self-pay

## 2020-02-25 DIAGNOSIS — I059 Rheumatic mitral valve disease, unspecified: Secondary | ICD-10-CM | POA: Diagnosis not present

## 2020-02-25 DIAGNOSIS — I4510 Unspecified right bundle-branch block: Secondary | ICD-10-CM | POA: Insufficient documentation

## 2020-02-25 DIAGNOSIS — Q249 Congenital malformation of heart, unspecified: Secondary | ICD-10-CM | POA: Diagnosis not present

## 2020-02-25 DIAGNOSIS — I340 Nonrheumatic mitral (valve) insufficiency: Secondary | ICD-10-CM | POA: Diagnosis not present

## 2020-02-25 DIAGNOSIS — Q231 Congenital insufficiency of aortic valve: Secondary | ICD-10-CM | POA: Diagnosis not present

## 2020-02-25 DIAGNOSIS — I351 Nonrheumatic aortic (valve) insufficiency: Secondary | ICD-10-CM | POA: Diagnosis not present

## 2020-02-25 LAB — ECHOCARDIOGRAM COMPLETE
Area-P 1/2: 3.42 cm2
MV M vel: 4.63 m/s
MV Peak grad: 85.7 mmHg
P 1/2 time: 581 msec
S' Lateral: 3.1 cm

## 2020-06-17 DIAGNOSIS — H10413 Chronic giant papillary conjunctivitis, bilateral: Secondary | ICD-10-CM | POA: Diagnosis not present

## 2020-07-01 ENCOUNTER — Ambulatory Visit (INDEPENDENT_AMBULATORY_CARE_PROVIDER_SITE_OTHER): Payer: Medicare HMO | Admitting: Podiatry

## 2020-07-01 ENCOUNTER — Encounter: Payer: Self-pay | Admitting: Podiatry

## 2020-07-01 ENCOUNTER — Other Ambulatory Visit: Payer: Self-pay

## 2020-07-01 DIAGNOSIS — M79676 Pain in unspecified toe(s): Secondary | ICD-10-CM | POA: Diagnosis not present

## 2020-07-01 DIAGNOSIS — B351 Tinea unguium: Secondary | ICD-10-CM | POA: Diagnosis not present

## 2020-07-01 NOTE — Progress Notes (Signed)
  Subjective:  Patient ID: Roberto Schultz, male    DOB: Jul 26, 1981,  MRN: [id number] HPI Chief Complaint  Patient presents with  . Nail Problem    Requesting nail care - presents with his dad today  . New Patient (Initial Visit)    39 y.o. male presents with the above complaint.   ROS: Denies fever chills nausea vomiting muscle aches pains calf pain back pain chest pain shortness of breath.  Past Medical History:  Diagnosis Date  . Allergic rhinitis due to pollen   . Down syndrome   . Heart disease, unspecified   . Leukocytopenia, unspecified   . Other acne   . Varicose veins    Past Surgical History:  Procedure Laterality Date  . endocardial cushion defect repair    . HIP FRACTURE SURGERY  1996  . MITRAL VALVE REPAIR     11 months old    Current Outpatient Medications:  .  amoxicillin (AMOXIL) 500 MG capsule, Take 4 capsules by mouth. 1 hour before dental appointments, Disp: , Rfl:  .  cetirizine (ZYRTEC) 10 MG chewable tablet, Chew 10 mg by mouth daily.  , Disp: , Rfl:  .  ibuprofen (ADVIL) 800 MG tablet, , Disp: , Rfl:  .  Multiple Vitamin (MULTIVITAMIN) tablet, Take 1 tablet by mouth daily.  , Disp: , Rfl:  .  olopatadine (PATANOL) 0.1 % ophthalmic solution, , Disp: , Rfl:  .  Olopatadine HCl (PATANASE) 0.6 % SOLN, Place 2 puffs into the nose daily as needed (Allergies)., Disp: , Rfl:  .  SODIUM FLUORIDE PO, Take 1 application by mouth daily. , Disp: , Rfl:  .  thyroid (ARMOUR) 15 MG tablet, Take 15 mg by mouth daily., Disp: , Rfl:  .  thyroid (ARMOUR) 30 MG tablet, Take 30 mg by mouth daily before breakfast., Disp: , Rfl:   Allergies  Allergen Reactions  . Soap Rash    Can only tide for washing clothes   Review of Systems Objective:  There were no vitals filed for this visit.  General: Well developed, nourished, in no acute distress, alert and oriented x3   Dermatological: Skin is warm, dry and supple bilateral. Nails x 10 are well maintained; remaining  integument appears unremarkable at this time. There are no open sores, no preulcerative lesions, no rash or signs of infection present.  Vascular: Dorsalis Pedis artery and Posterior Tibial artery pedal pulses are 2/4 bilateral with immedate capillary fill time. Pedal hair growth present. No varicosities and no lower extremity edema present bilateral.   Neruologic: Grossly intact via light touch bilateral. Vibratory intact via tuning fork bilateral. Protective threshold with Semmes Wienstein monofilament intact to all pedal sites bilateral. Patellar and Achilles deep tendon reflexes 2+ bilateral. No Babinski or clonus noted bilateral.   Musculoskeletal: No gross boney pedal deformities bilateral. No pain, crepitus, or limitation noted with foot and ankle range of motion bilateral. Muscular strength 5/5 in all groups tested bilateral.  Gait: Unassisted, Nonantalgic.    Radiographs:  None taken  Assessment & Plan:   Assessment: Nail dystrophy  Plan: Debridement of toenails 1 through 5 bilateral.     Abena Erdman T. [place], [place]

## 2020-08-22 ENCOUNTER — Ambulatory Visit: Payer: TRICARE For Life (TFL) | Admitting: Podiatry

## 2020-09-05 ENCOUNTER — Ambulatory Visit: Payer: TRICARE For Life (TFL) | Admitting: Podiatry

## 2020-09-19 ENCOUNTER — Ambulatory Visit: Payer: TRICARE For Life (TFL) | Admitting: Podiatry

## 2020-10-03 ENCOUNTER — Ambulatory Visit: Payer: TRICARE For Life (TFL) | Admitting: Podiatry

## 2020-10-17 ENCOUNTER — Ambulatory Visit: Payer: TRICARE For Life (TFL) | Admitting: Podiatry

## 2020-10-27 DIAGNOSIS — Q909 Down syndrome, unspecified: Secondary | ICD-10-CM | POA: Diagnosis not present

## 2020-10-27 DIAGNOSIS — E039 Hypothyroidism, unspecified: Secondary | ICD-10-CM | POA: Diagnosis not present

## 2020-10-27 DIAGNOSIS — Z79899 Other long term (current) drug therapy: Secondary | ICD-10-CM | POA: Diagnosis not present

## 2020-10-27 DIAGNOSIS — D72819 Decreased white blood cell count, unspecified: Secondary | ICD-10-CM | POA: Diagnosis not present

## 2020-10-27 DIAGNOSIS — Z0001 Encounter for general adult medical examination with abnormal findings: Secondary | ICD-10-CM | POA: Diagnosis not present

## 2020-10-27 DIAGNOSIS — Z136 Encounter for screening for cardiovascular disorders: Secondary | ICD-10-CM | POA: Diagnosis not present

## 2020-10-27 DIAGNOSIS — Z125 Encounter for screening for malignant neoplasm of prostate: Secondary | ICD-10-CM | POA: Diagnosis not present

## 2020-10-27 DIAGNOSIS — Z13220 Encounter for screening for lipoid disorders: Secondary | ICD-10-CM | POA: Diagnosis not present

## 2020-10-31 ENCOUNTER — Ambulatory Visit: Payer: TRICARE For Life (TFL) | Admitting: Podiatry

## 2020-11-14 ENCOUNTER — Ambulatory Visit: Payer: TRICARE For Life (TFL) | Admitting: Podiatry

## 2020-12-25 DIAGNOSIS — H10413 Chronic giant papillary conjunctivitis, bilateral: Secondary | ICD-10-CM | POA: Diagnosis not present

## 2020-12-25 DIAGNOSIS — H5203 Hypermetropia, bilateral: Secondary | ICD-10-CM | POA: Diagnosis not present

## 2021-01-25 DIAGNOSIS — R109 Unspecified abdominal pain: Secondary | ICD-10-CM | POA: Diagnosis not present

## 2021-01-25 DIAGNOSIS — R1011 Right upper quadrant pain: Secondary | ICD-10-CM | POA: Diagnosis not present

## 2021-02-03 ENCOUNTER — Other Ambulatory Visit: Payer: Self-pay

## 2021-02-03 ENCOUNTER — Encounter: Payer: Self-pay | Admitting: Family

## 2021-02-03 ENCOUNTER — Ambulatory Visit (INDEPENDENT_AMBULATORY_CARE_PROVIDER_SITE_OTHER): Payer: Medicare HMO | Admitting: Family

## 2021-02-03 VITALS — BP 112/60 | HR 60 | Ht 61.0 in | Wt 145.0 lb

## 2021-02-03 DIAGNOSIS — I351 Nonrheumatic aortic (valve) insufficiency: Secondary | ICD-10-CM | POA: Diagnosis not present

## 2021-02-03 DIAGNOSIS — Q249 Congenital malformation of heart, unspecified: Secondary | ICD-10-CM

## 2021-02-03 DIAGNOSIS — I340 Nonrheumatic mitral (valve) insufficiency: Secondary | ICD-10-CM

## 2021-02-03 DIAGNOSIS — I4510 Unspecified right bundle-branch block: Secondary | ICD-10-CM

## 2021-02-03 NOTE — Patient Instructions (Signed)
Medication Instructions:  Continue your current medications.   *If you need a refill on your cardiac medications before your next appointment, please call your pharmacy*   Lab Work: None ordered today.   Testing/Procedures: Your EKG today was stable compared to previous.    Follow-Up: At [facility], you and your health needs are our priority.  As part of our continuing mission to provide you with exceptional heart care, we have created designated Provider Care Teams.  These Care Teams include your primary Cardiologist (physician) and Advanced Practice Providers (APPs -  Physician Assistants and Nurse Practitioners) who all work together to provide you with the care you need, when you need it.  We recommend signing up for the patient portal called "MyChart".  Sign up information is provided on this After Visit Summary.  MyChart is used to connect with patients for Virtual Visits (Telemedicine).  Patients are able to view lab/test results, encounter notes, upcoming appointments, etc.  Non-urgent messages can be sent to your provider as well.   To learn more about what you can do with MyChart, go to [email or web address].    Your next appointment:   1 year(s)  The format for your next appointment:   In Person  Provider:   You may see Charlton Haws, MD or one of the following Advanced Practice Providers on your designated Care Team:   Georgie Chard, NP  Other Instructions  Heart Healthy Diet Recommendations: A low-salt diet is recommended. Meats should be grilled, baked, or boiled. Avoid fried foods. Focus on lean protein sources like fish or chicken with vegetables and fruits. The American Heart Association is a Chief Technology Officer!  American Heart Association Diet and Lifeystyle Recommendations   Exercise recommendations: The American Heart Association recommends 150 minutes of moderate intensity exercise weekly. Try 30 minutes of moderate intensity exercise 4-5 times per  week. This could include walking, jogging, or swimming.

## 2021-02-03 NOTE — Progress Notes (Signed)
Office Visit    Patient Name: Roberto Schultz Date of Encounter: 02/03/2021  PCP:  Darrow Bussing, MD   [facility]  Cardiologist:  Charlton Haws, MD  Advanced Practice Provider:  No care team member to display Electrophysiologist:  None    Chief Complaint    Roberto Schultz is a 40 y.o. male with a hx of Down syndrome, AV canal repair with mild residual MR/AR, RBBB, varicose veins, hypothyroidism, allergic rhinitis presents today for follow-up of valvular heart disease  Past Medical History    Past Medical History:  Diagnosis Date   Allergic rhinitis due to pollen    Down syndrome    Heart disease, unspecified    Leukocytopenia, unspecified    Other acne    Varicose veins    Past Surgical History:  Procedure Laterality Date   endocardial cushion defect repair     HIP FRACTURE SURGERY  1996   MITRAL VALVE REPAIR     11 months old    Allergies  Allergies  Allergen Reactions   Soap Rash    Can only tide for washing clothes    History of Present Illness    Roberto Schultz is a 40 y.o. male with a hx of Down syndrome, AV canal repair with mild residual MR/AR, RBBB, varicose veins, hypothyroidism, allergic rhinitis last seen 01/31/2020 by Dr. Eden Emms.  He lives in a group home with good support from family. Works in Production designer, theatre/television/film at [employer] Active by walking and playing soccer for [employer].  When last seen June 2021 he was doing overall well from a cardiac perspective.  His previous echo was 2013 with mild AR/MR so he was recommended for updated echocardiogram.  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   He presents  today for follow up with his dad, Roberto Schultz. Reports no shortness of breath nor dyspnea on exertion. Reports no chest pain, pressure, or tightness. No edema, orthopnea, PND. Reports no palpitations.  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   EKGs/Labs/Other Studies Reviewed:   The following studies were reviewed today:  02/25/20 echocardiogram  1. Left ventricular ejection fraction, by estimation, is 60 to 65%. The  left ventricle has normal function. The left ventricle has no regional  wall motion abnormalities. Left ventricular diastolic parameters were  normal.   2. Right ventricular systolic function is normal. The right ventricular  size is normal. There is normal pulmonary artery systolic pressure. The  estimated right ventricular systolic pressure is 23.1 mmHg.   3. Mild MR, suspect P3 prolapse. Jet is anteriorly directed. Reported  history of AV canal defect repair. The mitral valve is myxomatous. Mild  mitral valve regurgitation. No evidence of mitral stenosis.   4. Bicuspid AoV with fusion of the NCC/RCC with raphe. Mild  regurgitation. The aortic valve is bicuspid. Aortic valve regurgitation is  mild. No aortic stenosis is present.   5. The inferior vena cava is normal in size with greater than 50%  respiratory variability, suggesting right atrial pressure of 3 mmHg.  EKG:  EKG is  ordered today.  The ekg ordered today demonstrates NSR 60 bpm with known RBBB.  Recent Labs: No results  found for requested labs within last 8760 hours.  Recent Lipid Panel No results found for: CHOL, TRIG, HDL, CHOLHDL, VLDL, LDLCALC, LDLDIRECT  Home Medications   Current Meds  Medication Sig   acetaminophen (TYLENOL) 500 MG tablet Take 1,000 mg by mouth every 6 (six) hours as needed for fever.   amoxicillin (AMOXIL) 500 MG capsule Take 4 capsules by mouth. 1 hour before dental appointments   cetirizine (ZYRTEC) 10 MG chewable tablet Chew 10 mg by mouth daily.      Multiple Vitamin (MULTIVITAMIN) tablet Take 1 tablet by mouth daily.     olopatadine (PATANOL) 0.1 % ophthalmic solution Place 1 drop into both eyes as needed for allergies.   Olopatadine HCl 0.6 % SOLN Place 2 puffs into the nose daily as needed (Allergies).   SODIUM FLUORIDE PO Take 1 application by mouth daily.    thyroid (ARMOUR) 15 MG tablet Take 15 mg by mouth daily.   thyroid (ARMOUR) 30 MG tablet Take 30 mg by mouth daily before breakfast.     Review of Systems    All other systems reviewed and are otherwise negative except as noted above.  Physical Exam    VS:  BP 112/60   Pulse 60   Ht 5\' 1"  (1.549 m)   Wt 145 lb (65.8 kg)   SpO2 96%   BMI 27.40 kg/m  , BMI Body mass index is 27.4 kg/m.  Wt Readings from Last 3 Encounters:  02/03/21 145 lb (65.8 kg)  01/31/20 138 lb (62.6 kg)  10/11/17 134 lb 12 oz (61.1 kg)     GEN: Well nourished, well developed, in no acute distress. HEENT: normal. Neck: Supple, no JVD, carotid bruits, or masses. Cardiac: RRR, no murmurs, rubs, or gallops. No clubbing, cyanosis, edema.  Radials/PT 2+ and equal bilaterally.  Respiratory:  Respirations regular and unlabored, clear to auscultation bilaterally. GI: Soft, nontender, nondistended. MS: No deformity or atrophy. Skin: Warm and dry, no rash. Neuro:  Strength and sensation are intact. Psych: Normal affect.  Assessment & Plan    Congenital heart disease s/p AV canal repair with mild AI/MR by echo July 2021. Doing well from cardiac perspective. No shortness of breath nor edema. Heart healthy diet and regular cardiovascular exercise encouraged. Follow with periodic echo as clinically indicated.   RBBB- related to AV canal defect repair. Stable by EKG. No lightheadedness, dizziness, near syncope, syncope.   Varicose veins-s/p ablative laser with VVS. No recurrent edema.  Disposition: Follow up in 1 year(s) with Dr. August 2021 or APP  Signed, Eden Emms, NP 02/03/2021, 8:42  AM [facility]

## 2021-04-07 DIAGNOSIS — Z20822 Contact with and (suspected) exposure to covid-19: Secondary | ICD-10-CM | POA: Diagnosis not present

## 2021-04-12 DIAGNOSIS — Z20822 Contact with and (suspected) exposure to covid-19: Secondary | ICD-10-CM | POA: Diagnosis not present

## 2021-04-20 ENCOUNTER — Encounter (HOSPITAL_BASED_OUTPATIENT_CLINIC_OR_DEPARTMENT_OTHER): Payer: Self-pay | Admitting: Family

## 2021-04-20 ENCOUNTER — Telehealth: Payer: Self-pay | Admitting: Cardiovascular Disease

## 2021-04-20 NOTE — Telephone Encounter (Signed)
Called patient's DPR back to let him know a note has been sent through the mail and on Mychart.

## 2021-04-20 NOTE — Telephone Encounter (Signed)
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 He has been cleared by his PCP to do so, but he is also needing a letter from Dr. Eden Emms that he is cleared as well.

## 2021-04-20 NOTE — Telephone Encounter (Signed)
Note written, signed, and placed in outgoing mail. He should be able to view on MyChart as well.  Alver Sorrow, NP

## 2021-04-21 DIAGNOSIS — M1610 Unilateral primary osteoarthritis, unspecified hip: Secondary | ICD-10-CM | POA: Diagnosis not present

## 2021-04-21 DIAGNOSIS — Q909 Down syndrome, unspecified: Secondary | ICD-10-CM | POA: Diagnosis not present

## 2021-04-21 DIAGNOSIS — Z23 Encounter for immunization: Secondary | ICD-10-CM | POA: Diagnosis not present

## 2021-04-21 DIAGNOSIS — E039 Hypothyroidism, unspecified: Secondary | ICD-10-CM | POA: Diagnosis not present

## 2021-04-21 DIAGNOSIS — D72819 Decreased white blood cell count, unspecified: Secondary | ICD-10-CM | POA: Diagnosis not present

## 2021-04-22 ENCOUNTER — Other Ambulatory Visit: Payer: Self-pay | Admitting: Family Medicine

## 2021-04-22 ENCOUNTER — Ambulatory Visit
Admission: RE | Admit: 2021-04-22 | Discharge: 2021-04-22 | Disposition: A | Discharge status: Home / Self Care | Payer: Medicare HMO | Source: Ambulatory Visit | Attending: Family Medicine | Admitting: Family Medicine

## 2021-04-22 DIAGNOSIS — Q909 Down syndrome, unspecified: Secondary | ICD-10-CM

## 2021-04-22 DIAGNOSIS — M47812 Spondylosis without myelopathy or radiculopathy, cervical region: Secondary | ICD-10-CM | POA: Diagnosis not present

## 2021-05-06 ENCOUNTER — Ambulatory Visit (INDEPENDENT_AMBULATORY_CARE_PROVIDER_SITE_OTHER): Payer: Medicare HMO | Admitting: Podiatry

## 2021-05-06 ENCOUNTER — Other Ambulatory Visit: Payer: Self-pay

## 2021-05-06 ENCOUNTER — Encounter: Payer: Self-pay | Admitting: Podiatry

## 2021-05-06 DIAGNOSIS — Q909 Down syndrome, unspecified: Secondary | ICD-10-CM

## 2021-05-06 DIAGNOSIS — M79676 Pain in unspecified toe(s): Secondary | ICD-10-CM | POA: Diagnosis not present

## 2021-05-06 DIAGNOSIS — B351 Tinea unguium: Secondary | ICD-10-CM | POA: Diagnosis not present

## 2021-05-06 NOTE — Progress Notes (Signed)
This patient returns to the office for evaluation and treatment of long thick painful nails .  This patient is unable to trim his own nails since the patient cannot reach his feet.  Patient says the nails are painful walking and wearing his shoes.  Patient presents to the office with his step-father.He returns for preventive foot care services.  General Appearance  Alert, conversant and in no acute stress.  Vascular  Dorsalis pedis and posterior tibial  pulses are palpable  bilaterally.  Capillary return is within normal limits  bilaterally. Temperature is within normal limits  bilaterally.  Neurologic  Senn-Weinstein monofilament wire test within normal limits  bilaterally. Muscle power within normal limits bilaterally.  Nails Thick disfigured discolored nails with subungual debris  from hallux to fifth toes bilaterally. No evidence of bacterial infection or drainage bilaterally.  Orthopedic  No limitations of motion  feet .  No crepitus or effusions noted.  No bony pathology or digital deformities noted.  Mild  HAV  B/L.  Skin  normotropic skin with no porokeratosis noted bilaterally.  No signs of infections or ulcers noted.     Onychomycosis  Pain in toes right foot  Pain in toes left foot  Debridement  of nails  1-5  B/L with a nail nipper.  Nails were then filed using a dremel tool with no incidents.    RTC 3 months.  Stepfather desires fungus medicine for his fourth toenail left foot.   Helane Gunther DPM

## 2021-06-25 DIAGNOSIS — S39012A Strain of muscle, fascia and tendon of lower back, initial encounter: Secondary | ICD-10-CM | POA: Diagnosis not present

## 2021-07-30 DIAGNOSIS — M545 Low back pain, unspecified: Secondary | ICD-10-CM | POA: Diagnosis not present

## 2021-08-05 ENCOUNTER — Ambulatory Visit: Payer: TRICARE For Life (TFL) | Admitting: Podiatry

## 2021-08-26 ENCOUNTER — Ambulatory Visit: Payer: Medicare HMO | Admitting: Podiatry

## 2021-10-22 DIAGNOSIS — H5213 Myopia, bilateral: Secondary | ICD-10-CM | POA: Diagnosis not present

## 2021-10-22 DIAGNOSIS — Z0100 Encounter for examination of eyes and vision without abnormal findings: Secondary | ICD-10-CM | POA: Diagnosis not present

## 2021-12-02 ENCOUNTER — Ambulatory Visit
Admission: RE | Admit: 2021-12-02 | Discharge: 2021-12-02 | Disposition: A | Discharge status: Home / Self Care | Payer: Medicare HMO | Source: Ambulatory Visit | Attending: Family Medicine | Admitting: Family Medicine

## 2021-12-02 ENCOUNTER — Other Ambulatory Visit: Payer: Self-pay | Admitting: Family Medicine

## 2021-12-02 DIAGNOSIS — Z0001 Encounter for general adult medical examination with abnormal findings: Secondary | ICD-10-CM | POA: Diagnosis not present

## 2021-12-02 DIAGNOSIS — M25551 Pain in right hip: Secondary | ICD-10-CM | POA: Diagnosis not present

## 2021-12-02 DIAGNOSIS — E039 Hypothyroidism, unspecified: Secondary | ICD-10-CM | POA: Diagnosis not present

## 2021-12-02 DIAGNOSIS — M25552 Pain in left hip: Secondary | ICD-10-CM

## 2021-12-02 DIAGNOSIS — Z125 Encounter for screening for malignant neoplasm of prostate: Secondary | ICD-10-CM | POA: Diagnosis not present

## 2021-12-02 DIAGNOSIS — D72819 Decreased white blood cell count, unspecified: Secondary | ICD-10-CM | POA: Diagnosis not present

## 2021-12-02 DIAGNOSIS — M25559 Pain in unspecified hip: Secondary | ICD-10-CM | POA: Diagnosis not present

## 2021-12-02 DIAGNOSIS — R7309 Other abnormal glucose: Secondary | ICD-10-CM | POA: Diagnosis not present

## 2021-12-02 DIAGNOSIS — Q909 Down syndrome, unspecified: Secondary | ICD-10-CM | POA: Diagnosis not present

## 2021-12-02 DIAGNOSIS — Z79899 Other long term (current) drug therapy: Secondary | ICD-10-CM | POA: Diagnosis not present

## 2021-12-02 DIAGNOSIS — Z136 Encounter for screening for cardiovascular disorders: Secondary | ICD-10-CM | POA: Diagnosis not present

## 2021-12-16 DIAGNOSIS — M13851 Other specified arthritis, right hip: Secondary | ICD-10-CM | POA: Diagnosis not present

## 2021-12-16 DIAGNOSIS — M25551 Pain in right hip: Secondary | ICD-10-CM | POA: Diagnosis not present

## 2022-01-15 ENCOUNTER — Ambulatory Visit: Payer: Medicare HMO | Admitting: Podiatry

## 2022-01-27 ENCOUNTER — Ambulatory Visit: Payer: Medicare HMO | Admitting: Podiatry

## 2022-02-05 ENCOUNTER — Ambulatory Visit: Payer: Medicare HMO | Admitting: Podiatry

## 2022-02-15 ENCOUNTER — Encounter: Payer: Self-pay | Admitting: Podiatry

## 2022-02-15 ENCOUNTER — Ambulatory Visit (INDEPENDENT_AMBULATORY_CARE_PROVIDER_SITE_OTHER): Payer: Medicare HMO | Admitting: Podiatry

## 2022-02-15 DIAGNOSIS — Q909 Down syndrome, unspecified: Secondary | ICD-10-CM

## 2022-02-15 DIAGNOSIS — M79676 Pain in unspecified toe(s): Secondary | ICD-10-CM

## 2022-02-15 DIAGNOSIS — B351 Tinea unguium: Secondary | ICD-10-CM

## 2022-02-15 NOTE — Progress Notes (Signed)
This patient returns to the office for evaluation and treatment of long thick painful nails .  This patient is unable to trim his own nails since the patient cannot reach his feet.  Patient says the nails are painful walking and wearing his shoes.  Patient presents to the office with his step-father.  He has not been seen in over 10 months.  He returns for preventive foot care services.  General Appearance  Alert, conversant and in no acute stress.  Vascular  Dorsalis pedis and posterior tibial  pulses are palpable  bilaterally.  Capillary return is within normal limits  bilaterally. Temperature is within normal limits  bilaterally.  Neurologic  Senn-Weinstein monofilament wire test within normal limits  bilaterally. Muscle power within normal limits bilaterally.  Nails Thick disfigured discolored nails with subungual debris  from hallux to fifth toes bilaterally. No evidence of bacterial infection or drainage bilaterally.  Orthopedic  No limitations of motion  feet .  No crepitus or effusions noted.  No bony pathology or digital deformities noted.  Mild  HAV  B/L.  Skin  normotropic skin with no porokeratosis noted bilaterally.  No signs of infections or ulcers noted.     Onychomycosis  Pain in toes right foot  Pain in toes left foot  Debridement  of nails  1-5  B/L with a nail nipper.  Nails were then filed using a dremel tool with no incidents.    RTC 3 months.     Helane Gunther DPM

## 2022-05-10 ENCOUNTER — Ambulatory Visit: Payer: Medicare HMO | Admitting: Podiatry

## 2022-05-18 ENCOUNTER — Ambulatory Visit: Payer: Medicare HMO | Admitting: Cardiovascular Disease

## 2022-05-19 ENCOUNTER — Ambulatory Visit: Payer: Medicare HMO | Attending: Cardiovascular Disease | Admitting: Cardiovascular Disease

## 2022-05-19 ENCOUNTER — Encounter: Payer: Self-pay | Admitting: Cardiovascular Disease

## 2022-05-19 VITALS — BP 110/70 | HR 62 | Ht 61.0 in | Wt 144.2 lb

## 2022-05-19 DIAGNOSIS — I4510 Unspecified right bundle-branch block: Secondary | ICD-10-CM | POA: Diagnosis not present

## 2022-05-19 DIAGNOSIS — Q249 Congenital malformation of heart, unspecified: Secondary | ICD-10-CM

## 2022-05-19 DIAGNOSIS — I351 Nonrheumatic aortic (valve) insufficiency: Secondary | ICD-10-CM

## 2022-05-19 NOTE — Patient Instructions (Addendum)
Medication Instructions:  Your physician recommends that you continue on your current medications as directed. Please refer to the Current Medication list given to you today.  *If you need a refill on your cardiac medications before your next appointment, please call your pharmacy*  Lab Work: If you have labs (blood work) drawn today and your tests are completely normal, you will receive your results only by: [place] (if you have MyChart) OR A paper copy in the mail If you have any lab test that is abnormal or we need to change your treatment, we will call you to review the results.  Testing/Procedures: None ordered today.  Follow-Up: At [facility], you and your health needs are our priority.  As part of our continuing mission to provide you with exceptional heart care, we have created designated Provider Care Teams.  These Care Teams include your primary Cardiologist (physician) and Advanced Practice Providers (APPs -  Physician Assistants and Nurse Practitioners) who all work together to provide you with the care you need, when you need it.  We recommend signing up for the patient portal called "MyChart".  Sign up information is provided on this After Visit Summary.  MyChart is used to connect with patients for Virtual Visits (Telemedicine).  Patients are able to view lab/test results, encounter notes, upcoming appointments, etc.  Non-urgent messages can be sent to your provider as well.   To learn more about what you can do with MyChart, go to [email or web address].    Your next appointment:   12 month(s)  The format for your next appointment:   In Person  Provider:   Jenkins Rouge, MD     [employer] [employer]

## 2022-05-19 NOTE — Progress Notes (Signed)
Patient ID: Roberto Schultz, male   DOB: 05-17-1981, 41 y.o.   MRN: [id number]     41 y.o. with [place] living well at group home. 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 Normal EF Last echo 9/11 reviewed. No dyspnea, palpitations, SSCP, edema. No longer playing basketball. Seeing dentist at least twice a year. His dad was with him today. Older sister doing well and graduated with job at [employer]. Middle son not so well hanging around "rappers" and has an unwed child he is not supporting. 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 No evidence of progressive heart block   Has had some leukopenia ? 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 P3 prolapse bicuspid AV and mild AR    ROS: Denies fever, malais, weight loss, blurry vision, decreased visual acuity, cough, sputum, SOB, hemoptysis, pleuritic pain, palpitaitons, heartburn, abdominal pain, melena, lower extremity edema, claudication, or rash.  All other systems reviewed and negative  General: BP 110/70   Pulse 62   Ht 5\' 1"  (1.549 m)   Wt 144 lb 3.2 oz (65.4 kg)   SpO2 99%   BMI 27.25 kg/m  Affect appropriate Healthy:  appears stated age 25: normal Neck supple with no adenopathy JVP normal no bruits no thyromegaly Lungs clear with no wheezing and good diaphragmatic motion Heart:  S1/S2 apical  murmur, no rub, gallop or click PMI normal Abdomen: benighn, BS positve, no tenderness, no AAA no bruit.  No HSM or HJR Distal pulses intact with no bruits No edema Neuro non-focal Skin warm and dry No muscular weakness    Current Outpatient Medications  Medication Sig Dispense Refill   acetaminophen (TYLENOL) 500 MG tablet Take 1,000 mg by mouth every 6 (six) hours as needed for fever.     amoxicillin (AMOXIL)  500 MG capsule Take 4 capsules by mouth. 1 hour before dental appointments     cetirizine (ZYRTEC) 10 MG chewable tablet Chew 10 mg by mouth daily.       diclofenac (VOLTAREN) 50 MG EC tablet      ibuprofen (ADVIL) 800 MG tablet      MODERNA COVID-19 VACCINE 100 MCG/0.5ML injection      Multiple Vitamin (MULTIVITAMIN) tablet Take 1 tablet by mouth daily.       olopatadine (PATANOL) 0.1 % ophthalmic solution Place 1 drop into both eyes as needed for allergies.     SODIUM FLUORIDE PO Take 1 application by mouth daily.      thyroid (ARMOUR) 15 MG tablet Take 15 mg by mouth daily.     thyroid (ARMOUR) 30 MG tablet Take 30 mg by mouth daily before breakfast.     Olopatadine HCl 0.6 % SOLN Place 2 puffs into the nose daily as needed (Allergies). (Patient not taking: Reported on 05/19/2022)     No current facility-administered medications for this visit.    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

## 2022-06-03 DIAGNOSIS — D72819 Decreased white blood cell count, unspecified: Secondary | ICD-10-CM | POA: Diagnosis not present

## 2022-06-03 DIAGNOSIS — E039 Hypothyroidism, unspecified: Secondary | ICD-10-CM | POA: Diagnosis not present

## 2022-06-03 DIAGNOSIS — F79 Unspecified intellectual disabilities: Secondary | ICD-10-CM | POA: Diagnosis not present

## 2022-06-03 DIAGNOSIS — M5090 Cervical disc disorder, unspecified, unspecified cervical region: Secondary | ICD-10-CM | POA: Diagnosis not present

## 2022-06-03 DIAGNOSIS — Q909 Down syndrome, unspecified: Secondary | ICD-10-CM | POA: Diagnosis not present

## 2022-06-22 ENCOUNTER — Encounter: Payer: Self-pay | Admitting: Podiatry

## 2022-06-22 ENCOUNTER — Ambulatory Visit (INDEPENDENT_AMBULATORY_CARE_PROVIDER_SITE_OTHER): Payer: Medicare HMO | Admitting: Podiatry

## 2022-06-22 DIAGNOSIS — M79676 Pain in unspecified toe(s): Secondary | ICD-10-CM

## 2022-06-22 DIAGNOSIS — Q909 Down syndrome, unspecified: Secondary | ICD-10-CM | POA: Diagnosis not present

## 2022-06-22 DIAGNOSIS — B351 Tinea unguium: Secondary | ICD-10-CM

## 2022-06-22 NOTE — Progress Notes (Signed)
This patient returns to the office for evaluation and treatment of long thick painful nails .  This patient is unable to trim his own nails since the patient cannot reach his feet.  Patient says the nails are painful walking and wearing his shoes.    He returns for preventive foot care services.  General Appearance  Alert, conversant and in no acute stress.  Vascular  Dorsalis pedis and posterior tibial  pulses are palpable  bilaterally.  Capillary return is within normal limits  bilaterally. Temperature is within normal limits  bilaterally.  Neurologic  Senn-Weinstein monofilament wire test within normal limits  bilaterally. Muscle power within normal limits bilaterally.  Nails Thick disfigured discolored nails with subungual debris  from hallux to fifth toes bilaterally. No evidence of bacterial infection or drainage bilaterally.  Orthopedic  No limitations of motion  feet .  No crepitus or effusions noted.  No bony pathology or digital deformities noted.  Mild  HAV  B/L.  Skin  normotropic skin with no porokeratosis noted bilaterally.  No signs of infections or ulcers noted.     Onychomycosis  Pain in toes right foot  Pain in toes left foot  Debridement  of nails  1-5  B/L with a nail nipper.  Nails were then filed using a dremel tool with no incidents.    RTC 4 months.     Gardiner Barefoot DPM

## 2022-06-23 IMAGING — CR DG HIP (WITH OR WITHOUT PELVIS) 3-4V BILAT
3 series · 3 of 3 positions shown · non-contrast
Comparison: 06/29/2018

CLINICAL DATA: Chronic bilateral hip pain

EXAM:
DG HIP (WITH OR WITHOUT PELVIS) 3-4V BILAT

[t pelvis a.p.]
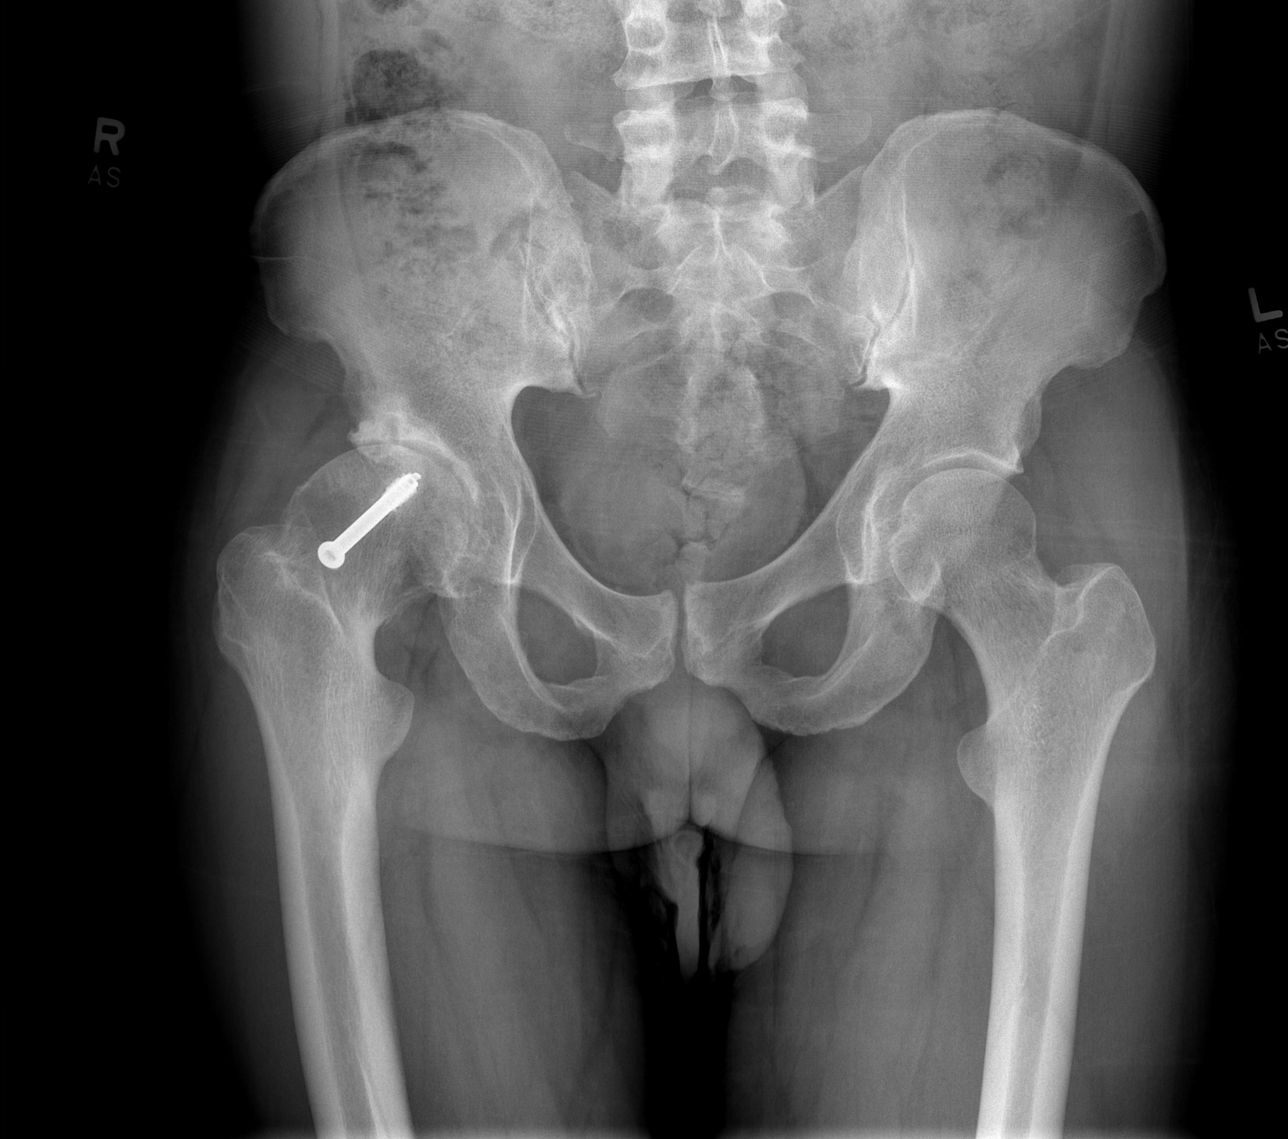

[t hip frog leg right]
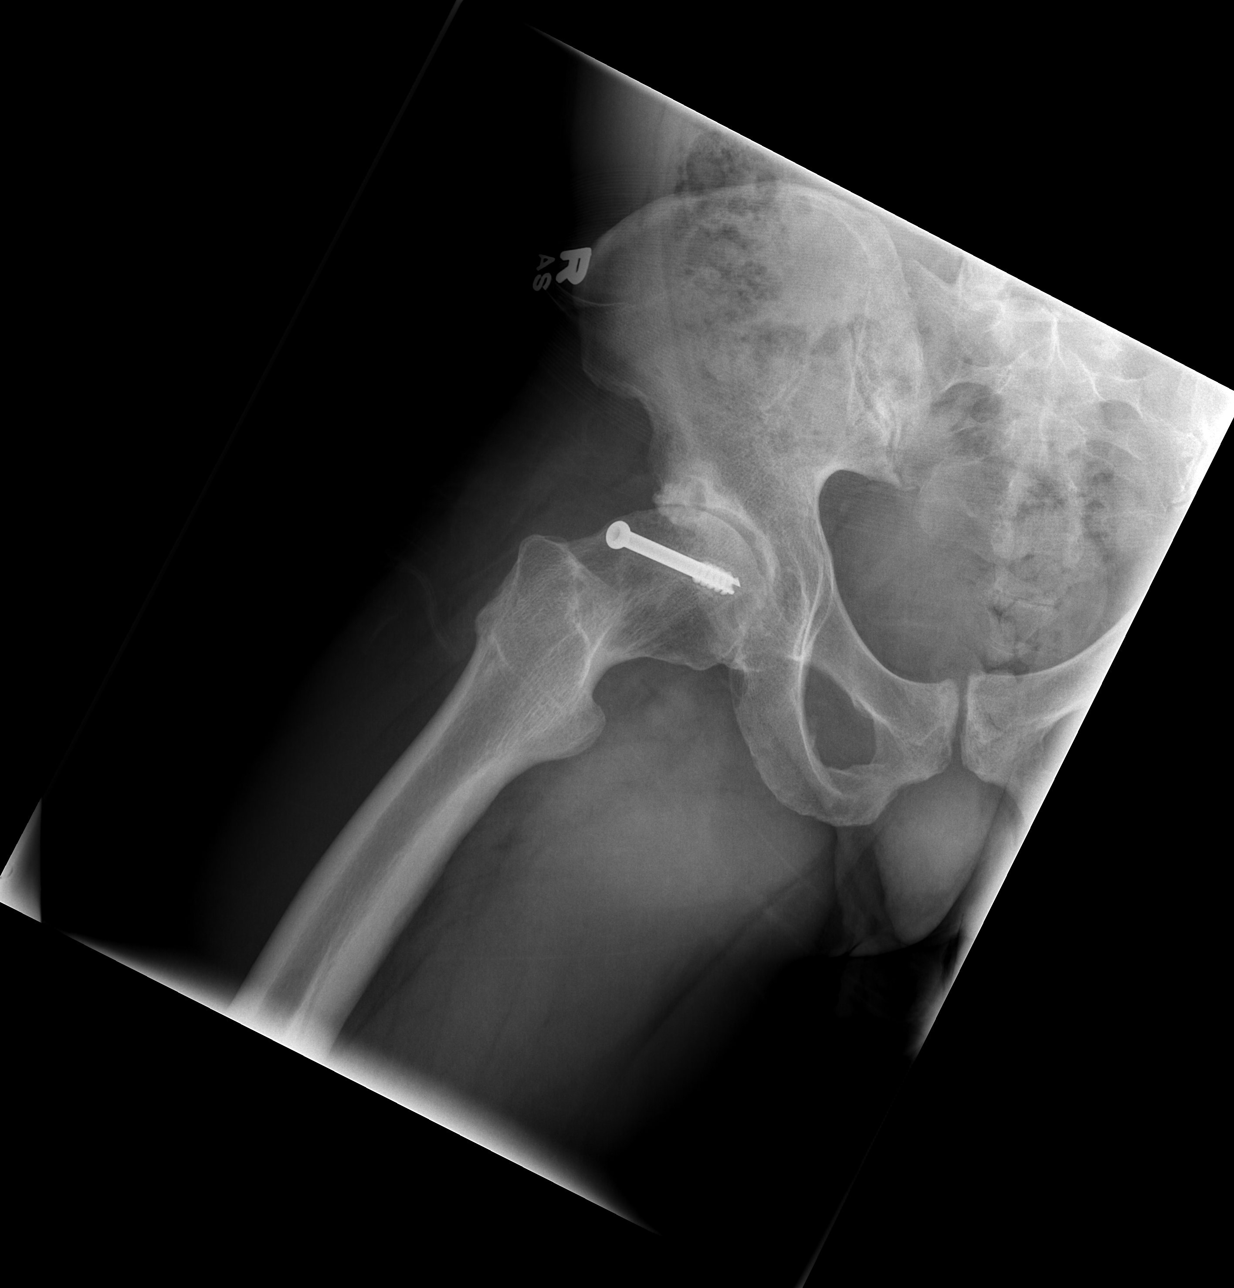

[t hip frog leg left]
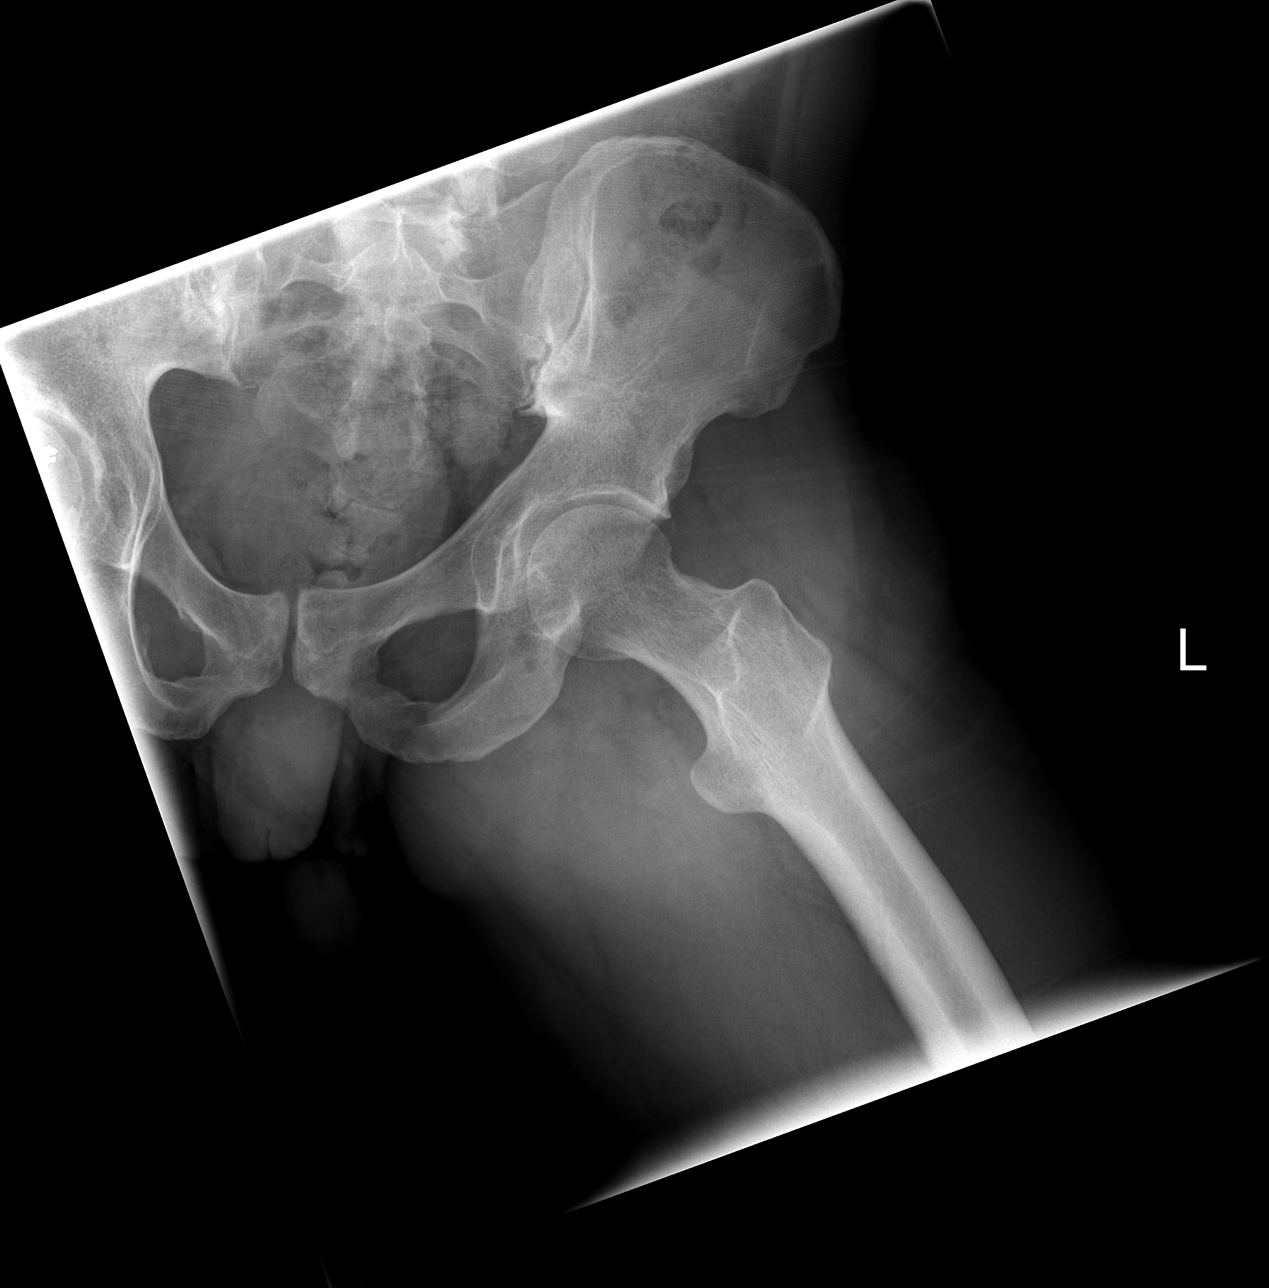

[3 of 3 positions shown; findings below may reference images not displayed]

FINDINGS: Left hip shows no fracture or malalignment. The joint space is
patent.

Previous screw fixation of the proximal right femur. No fracture.
Moderate severe arthritis of the right hip with bone on bone
appearance. Subarticular sclerosis findings are progressed since
5076.
IMPRESSION: 1. Screw fixation of proximal right femur with moderate advanced
right hip arthritis
2. Negative left hip radiographs

## 2022-08-23 ENCOUNTER — Ambulatory Visit: Payer: Medicare HMO | Admitting: Cardiovascular Disease

## 2022-10-25 ENCOUNTER — Ambulatory Visit: Payer: Medicare HMO | Admitting: Podiatry

## 2022-10-26 DIAGNOSIS — H10413 Chronic giant papillary conjunctivitis, bilateral: Secondary | ICD-10-CM | POA: Diagnosis not present

## 2022-10-26 DIAGNOSIS — Z0100 Encounter for examination of eyes and vision without abnormal findings: Secondary | ICD-10-CM | POA: Diagnosis not present

## 2022-10-27 ENCOUNTER — Ambulatory Visit (INDEPENDENT_AMBULATORY_CARE_PROVIDER_SITE_OTHER): Payer: Medicare HMO | Admitting: Podiatry

## 2022-10-27 ENCOUNTER — Encounter: Payer: Self-pay | Admitting: Podiatry

## 2022-10-27 DIAGNOSIS — B351 Tinea unguium: Secondary | ICD-10-CM

## 2022-10-27 DIAGNOSIS — M79676 Pain in unspecified toe(s): Secondary | ICD-10-CM

## 2022-10-27 DIAGNOSIS — Q909 Down syndrome, unspecified: Secondary | ICD-10-CM

## 2022-10-27 NOTE — Progress Notes (Signed)
This patient returns to the office for evaluation and treatment of long thick painful nails .  This patient is unable to trim his own nails since the patient cannot reach his feet.  Patient says the nails are painful walking and wearing his shoes.    He returns for preventive foot care services.  General Appearance  Alert, conversant and in no acute stress.  Vascular  Dorsalis pedis and posterior tibial  pulses are palpable  bilaterally.  Capillary return is within normal limits  bilaterally. Temperature is within normal limits  bilaterally.  Neurologic  Senn-Weinstein monofilament wire test within normal limits  bilaterally. Muscle power within normal limits bilaterally.  Nails Thick disfigured discolored nails with subungual debris  from hallux to fifth toes bilaterally. No evidence of bacterial infection or drainage bilaterally.  Orthopedic  No limitations of motion  feet .  No crepitus or effusions noted.  No bony pathology or digital deformities noted.  Mild  HAV  B/L.  Skin  normotropic skin with no porokeratosis noted bilaterally.  No signs of infections or ulcers noted.     Onychomycosis  Pain in toes right foot  Pain in toes left foot  Debridement  of nails  1-5  B/L with a nail nipper.  Nails were then filed using a dremel tool with no incidents.    RTC 4 months.     Gardiner Barefoot DPM

## 2022-12-14 DIAGNOSIS — M5090 Cervical disc disorder, unspecified, unspecified cervical region: Secondary | ICD-10-CM | POA: Diagnosis not present

## 2022-12-14 DIAGNOSIS — Z0001 Encounter for general adult medical examination with abnormal findings: Secondary | ICD-10-CM | POA: Diagnosis not present

## 2022-12-14 DIAGNOSIS — R7301 Impaired fasting glucose: Secondary | ICD-10-CM | POA: Diagnosis not present

## 2022-12-14 DIAGNOSIS — F79 Unspecified intellectual disabilities: Secondary | ICD-10-CM | POA: Diagnosis not present

## 2022-12-14 DIAGNOSIS — Q909 Down syndrome, unspecified: Secondary | ICD-10-CM | POA: Diagnosis not present

## 2022-12-14 DIAGNOSIS — Z125 Encounter for screening for malignant neoplasm of prostate: Secondary | ICD-10-CM | POA: Diagnosis not present

## 2022-12-14 DIAGNOSIS — E039 Hypothyroidism, unspecified: Secondary | ICD-10-CM | POA: Diagnosis not present

## 2022-12-14 DIAGNOSIS — D72819 Decreased white blood cell count, unspecified: Secondary | ICD-10-CM | POA: Diagnosis not present

## 2023-01-10 DIAGNOSIS — M25562 Pain in left knee: Secondary | ICD-10-CM | POA: Diagnosis not present

## 2023-01-21 ENCOUNTER — Encounter: Payer: Self-pay | Admitting: Podiatry

## 2023-01-27 ENCOUNTER — Ambulatory Visit: Payer: Medicare HMO | Admitting: Podiatry

## 2023-01-31 ENCOUNTER — Ambulatory Visit (INDEPENDENT_AMBULATORY_CARE_PROVIDER_SITE_OTHER): Payer: Medicare HMO | Admitting: Podiatry

## 2023-01-31 ENCOUNTER — Encounter: Payer: Self-pay | Admitting: Podiatry

## 2023-01-31 DIAGNOSIS — M79676 Pain in unspecified toe(s): Secondary | ICD-10-CM

## 2023-01-31 DIAGNOSIS — B351 Tinea unguium: Secondary | ICD-10-CM | POA: Diagnosis not present

## 2023-01-31 NOTE — Progress Notes (Signed)
This patient returns to the office for evaluation and treatment of long thick painful nails .  This patient is unable to trim his own nails since the patient cannot reach his feet.  Patient says the nails are painful walking and wearing his shoes.    He returns for preventive foot care services.  General Appearance  Alert, conversant and in no acute stress.  Vascular  Dorsalis pedis and posterior tibial  pulses are palpable  bilaterally.  Capillary return is within normal limits  bilaterally. Temperature is within normal limits  bilaterally.  Neurologic  Senn-Weinstein monofilament wire test within normal limits  bilaterally. Muscle power within normal limits bilaterally.  Nails Thick disfigured discolored nails with subungual debris  from hallux to fifth toes bilaterally. No evidence of bacterial infection or drainage bilaterally.  Orthopedic  No limitations of motion  feet .  No crepitus or effusions noted.  No bony pathology or digital deformities noted.  Mild  HAV  B/L.  Skin  normotropic skin with no porokeratosis noted bilaterally.  No signs of infections or ulcers noted.     Onychomycosis  Pain in toes right foot  Pain in toes left foot  Debridement  of nails  1-5  B/L with a nail nipper.  Nails were then filed using a dremel tool with no incidents.    RTC 4 months.     Thressa Shiffer DPM   

## 2023-03-24 DIAGNOSIS — E039 Hypothyroidism, unspecified: Secondary | ICD-10-CM | POA: Diagnosis not present

## 2023-05-04 ENCOUNTER — Ambulatory Visit: Payer: Medicare HMO | Admitting: Podiatry

## 2023-06-10 ENCOUNTER — Ambulatory Visit: Payer: Medicare HMO | Admitting: General Practice

## 2023-06-24 DIAGNOSIS — E039 Hypothyroidism, unspecified: Secondary | ICD-10-CM | POA: Diagnosis not present

## 2023-06-29 DIAGNOSIS — E039 Hypothyroidism, unspecified: Secondary | ICD-10-CM | POA: Diagnosis not present

## 2023-06-29 DIAGNOSIS — R7301 Impaired fasting glucose: Secondary | ICD-10-CM | POA: Diagnosis not present

## 2023-06-29 DIAGNOSIS — D72819 Decreased white blood cell count, unspecified: Secondary | ICD-10-CM | POA: Diagnosis not present

## 2023-06-29 DIAGNOSIS — Q909 Down syndrome, unspecified: Secondary | ICD-10-CM | POA: Diagnosis not present

## 2023-06-29 DIAGNOSIS — F79 Unspecified intellectual disabilities: Secondary | ICD-10-CM | POA: Diagnosis not present

## 2023-07-04 NOTE — Progress Notes (Unsigned)
Cardiology Clinic Note   Patient Name: Roberto Schultz Date of Encounter: 07/04/2023  Primary Care Provider:  Darrow Bussing, MD Primary Cardiologist:  Charlton Haws, MD  Patient Profile    Roberto Schultz 42 year old male presents the clinic today for follow-up evaluation of his mitral valve regurgitation and right bundle branch block.  Past Medical History    Past Medical History:  Diagnosis Date   Allergic rhinitis due to pollen    Down syndrome    Heart disease, unspecified    Leukocytopenia, unspecified    Other acne    Varicose veins    Past Surgical History:  Procedure Laterality Date   endocardial cushion defect repair     HIP FRACTURE SURGERY  1996   MITRAL VALVE REPAIR     11 months old    Allergies  Allergies  Allergen Reactions   Soap Rash    Can only tide for washing clothes    History of Present Illness    Roberto Schultz has a PMH of varicose veins, allergic rhinitis, leukopenia, Down syndrome, right bundle branch block, AI and mitral valve regurgitation.  He underwent AV canal repair.  Echocardiogram 9/11 showed normal EF.  Echocardiogram 7/21 showed an EF of 60-65%, mild MR, bicuspid aortic valve and mild aortic valve regurgitation.  He was seen in follow-up by Dr. Eden Emms 05/19/2022.  During that time he had no dyspnea, palpitations, chest discomfort or lower extremity swelling.  He was no longer playing baseball.  He was seen in the dentist regularly.  He presented with his dad and older sister.  He was seen by vein and vascular who did laser treatment on his left lower extremity varicosities.  He continued to work maintenance at [employer]  He was walking a lot and playing soccer.  He denied shortness of breath, pleuritic pain, palpitations, heartburn, melena, lower extremity edema, and claudication.  His blood pressure was noted to be 110/70 and his pulse was 62.  He was noted to have S1/S2 apical murmur.  Follow-up in 1 year was  planned.  He presents to the clinic today for follow-up evaluation and states***.  *** denies chest pain, shortness of breath, lower extremity edema, fatigue, palpitations, melena, hematuria, hemoptysis, diaphoresis, weakness, presyncope, syncope, orthopnea, and PND.  Congenital heart disease-status post AV canal repair.  Echocardiogram 7/21 showed an EF of 60 to 65%, mild MR, bicuspid aortic valve with mild aortic valve regurgitation.  Continues to be physically active.  Denies increased work of breathing or activity intolerance. Repeat echocardiogram when clinically indicated  Right bundle branch block-denies episodes of lightheadedness, presyncope or syncope.  Felt to be related to AV canal defect and repair.  EKG today shows***.  Hypothyroidism-follows with PCP. Continue thyroid replacement therapy  Varicose veins-healed well posttreatment.  Underwent ablative laser treatment of the left lower extremity. May use lower extremity support stockings. Follows with vein and vascular surgery  Disposition: Follow-up with Dr. Eden Emms or me in 12 months.    Home Medications    Prior to Admission medications   Medication Sig Start Date End Date Taking? Authorizing Provider  acetaminophen (TYLENOL) 500 MG tablet Take 1,000 mg by mouth every 6 (six) hours as needed for fever.    [provider]  amoxicillin (AMOXIL) 500 MG capsule Take 4 capsules by mouth. 1 hour before dental appointments 12/19/19   [provider]  cetirizine (ZYRTEC) 10 MG chewable tablet Chew 10 mg by mouth daily.      [provider]  diclofenac (VOLTAREN) 50 MG EC tablet  01/25/21   [provider]  ibuprofen (ADVIL) 800 MG tablet     [provider]  MODERNA COVID-19 VACCINE 100 MCG/0.5ML injection  03/03/21   [provider]  Multiple Vitamin (MULTIVITAMIN) tablet Take 1 tablet by mouth daily.      [provider]  olopatadine (PATANOL) 0.1 % ophthalmic  solution Place 1 drop into both eyes as needed for allergies. 06/20/20   [provider]  Olopatadine HCl 0.6 % SOLN Place 2 puffs into the nose daily as needed (Allergies).    [provider]  SODIUM FLUORIDE PO Take 1 application by mouth daily.     [provider]  thyroid (ARMOUR) 15 MG tablet Take 15 mg by mouth daily.    [provider]  thyroid (ARMOUR) 30 MG tablet Take 30 mg by mouth daily before breakfast.    [provider]    Family History    Family History  Problem Relation Age of Onset   Cancer Father        ALS   Other Other        noncontributory   He indicated that his mother is alive. He indicated that his father is deceased. He indicated that his maternal grandmother is alive. He indicated that his maternal grandfather is alive. He indicated that his paternal grandmother is alive. He indicated that his paternal grandfather is alive. He indicated that the status of his other is unknown.  Social History    Social History   Socioeconomic History   Marital status: Single    Spouse name: Not on file   Number of children: Not on file   Years of education: Not on file   Highest education level: Not on file  Occupational History   Occupation: Dentist: UNEMPLOYED  Tobacco Use   Smoking status: Never   Smokeless tobacco: Never  Vaping Use   Vaping status: Never Used  Substance and Sexual Activity   Alcohol use: No   Drug use: No   Sexual activity: Not on file  Other Topics Concern   Not on file  Social History Narrative   Not on file   Social Determinants of Health   Financial Resource Strain: Not on file  Food Insecurity: Not on file  Transportation Needs: Not on file  Physical Activity: Not on file  Stress: Not on file  Social Connections: Not on file  Intimate Partner Violence: Not on file     Review of Systems    General:  No chills, fever, night sweats or weight changes.  Cardiovascular:   No chest pain, dyspnea on exertion, edema, orthopnea, palpitations, paroxysmal nocturnal dyspnea. Dermatological: No rash, lesions/masses Respiratory: No cough, dyspnea Urologic: No hematuria, dysuria Abdominal:   No nausea, vomiting, diarrhea, bright red blood per rectum, melena, or hematemesis Neurologic:  No visual changes, wkns, changes in mental status. All other systems reviewed and are otherwise negative except as noted above.  Physical Exam    VS:  There were no vitals taken for this visit. , BMI There is no height or weight on file to calculate BMI. GEN: Well nourished, well developed, in no acute distress. HEENT: normal. Neck: Supple, no JVD, carotid bruits, or masses. Cardiac: RRR, no murmurs, rubs, or gallops. No clubbing, cyanosis, edema.  Radials/DP/PT 2+ and equal bilaterally.  Respiratory:  Respirations regular and unlabored, clear to auscultation bilaterally. GI: Soft, nontender, nondistended, BS +  x 4. MS: no deformity or atrophy. Skin: warm and dry, no rash. Neuro:  Strength and sensation are intact. Psych: Normal affect.  Accessory Clinical Findings    Recent Labs: No results found for requested labs within last 365 days.   Recent Lipid Panel No results found for: "CHOL", "TRIG", "HDL", "CHOLHDL", "VLDL", "LDLCALC", "LDLDIRECT"  No BP recorded.  {Refresh Note OR Click here to enter BP  :1}***    ECG personally reviewed by me today- ***     Echocardiogram 02/25/2020   IMPRESSIONS     1. Left ventricular ejection fraction, by estimation, is 60 to 65%. The  left ventricle has normal function. The left ventricle has no regional  wall motion abnormalities. Left ventricular diastolic parameters were  normal.   2. Right ventricular systolic function is normal. The right ventricular  size is normal. There is normal pulmonary artery systolic pressure. The  estimated right ventricular systolic pressure is 23.1 mmHg.   3. Mild MR, suspect P3 prolapse. Jet is  anteriorly directed. Reported  history of AV canal defect repair. The mitral valve is myxomatous. Mild  mitral valve regurgitation. No evidence of mitral stenosis.   4. Bicuspid AoV with fusion of the NCC/RCC with raphe. Mild  regurgitation. The aortic valve is bicuspid. Aortic valve regurgitation is  mild. No aortic stenosis is present.   5. The inferior vena cava is normal in size with greater than 50%  respiratory variability, suggesting right atrial pressure of 3 mmHg.   Comparison(s): No significant change from prior study.   FINDINGS   Left Ventricle: Left ventricular ejection fraction, by estimation, is 60  to 65%. The left ventricle has normal function. The left ventricle has no  regional wall motion abnormalities. The left ventricular internal cavity  size was normal in size. There is   no left ventricular hypertrophy. Left ventricular diastolic parameters  were normal.   Right Ventricle: The right ventricular size is normal. No increase in  right ventricular wall thickness. Right ventricular systolic function is  normal. There is normal pulmonary artery systolic pressure. The tricuspid  regurgitant velocity is 2.24 m/s, and   with an assumed right atrial pressure of 3 mmHg, the estimated right  ventricular systolic pressure is 23.1 mmHg.   Left Atrium: Left atrial size was normal in size.   Right Atrium: Right atrial size was normal in size.   Pericardium: Trivial pericardial effusion is present.   Mitral Valve: Mild MR, suspect P3 prolapse. Jet is anteriorly directed.  Reported history of AV canal defect repair. The mitral valve is  myxomatous. Mild mitral valve regurgitation, with anteriorly-directed jet.  No evidence of mitral valve stenosis.   Tricuspid Valve: The tricuspid valve is grossly normal. Tricuspid valve  regurgitation is mild . No evidence of tricuspid stenosis.   Aortic Valve: Bicuspid AoV with fusion of the NCC/RCC with raphe. Mild  regurgitation.  The aortic valve is bicuspid. Aortic valve regurgitation is  mild. Aortic regurgitation PHT measures 581 msec. No aortic stenosis is  present.   Pulmonic Valve: The pulmonic valve was grossly normal. Pulmonic valve  regurgitation is trivial. No evidence of pulmonic stenosis.   Aorta: The aortic root and ascending aorta are structurally normal, with  no evidence of dilitation.   Venous: The inferior vena cava is normal in size with greater than 50%  respiratory variability, suggesting right atrial pressure of 3 mmHg.   IAS/Shunts: The atrial septum is grossly normal.      Assessment &  Plan   1.  ***   Thomasene Ripple. Jayveon Convey NP-C     07/04/2023, 7:11 AM [facility] [address] Office [phone] Fax [phone]    I spent***minutes examining this patient, reviewing medications, and using patient centered shared decision making involving her cardiac care.   I spent greater than 20 minutes reviewing her past medical history,  medications, and prior cardiac tests.

## 2023-07-11 ENCOUNTER — Ambulatory Visit: Payer: Medicare HMO | Attending: General Practice | Admitting: General Practice

## 2023-07-22 ENCOUNTER — Encounter: Payer: Self-pay | Admitting: Nurse Practitioner

## 2023-07-22 ENCOUNTER — Ambulatory Visit: Payer: Medicare HMO | Attending: Nurse Practitioner | Admitting: Nurse Practitioner

## 2023-07-22 VITALS — BP 114/66 | HR 62 | Ht 65.0 in | Wt 144.0 lb

## 2023-07-22 DIAGNOSIS — Z136 Encounter for screening for cardiovascular disorders: Secondary | ICD-10-CM | POA: Diagnosis not present

## 2023-07-22 DIAGNOSIS — I83892 Varicose veins of left lower extremities with other complications: Secondary | ICD-10-CM | POA: Diagnosis not present

## 2023-07-22 DIAGNOSIS — E039 Hypothyroidism, unspecified: Secondary | ICD-10-CM | POA: Diagnosis not present

## 2023-07-22 DIAGNOSIS — I351 Nonrheumatic aortic (valve) insufficiency: Secondary | ICD-10-CM | POA: Diagnosis not present

## 2023-07-22 DIAGNOSIS — I4510 Unspecified right bundle-branch block: Secondary | ICD-10-CM

## 2023-07-22 DIAGNOSIS — I340 Nonrheumatic mitral (valve) insufficiency: Secondary | ICD-10-CM | POA: Diagnosis not present

## 2023-07-22 DIAGNOSIS — Q249 Congenital malformation of heart, unspecified: Secondary | ICD-10-CM

## 2023-07-22 NOTE — Progress Notes (Signed)
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  Past Medical History    Past Medical History:  Diagnosis Date   Allergic rhinitis due to pollen    Down syndrome    Heart disease, unspecified    Leukocytopenia, unspecified    Other acne    Varicose veins    Past Surgical History:  Procedure Laterality Date   endocardial cushion defect repair     HIP FRACTURE SURGERY  1996   MITRAL VALVE REPAIR     16 months old    Allergies  Allergies  Allergen Reactions   Soap Rash    Can only tide for washing clothes     Labs/Other Studies Reviewed    The following studies were reviewed today:  Cardiac Studies & Procedures      ECHOCARDIOGRAM  ECHOCARDIOGRAM COMPLETE 02/25/2020  Narrative ECHOCARDIOGRAM REPORT    Patient Name:   Roberto Schultz Date of Exam: 02/25/2020 Medical Rec #:  [id number]         Height:       65.0 in Accession #:    [id number]        Weight:       138.0 lb Date of Birth:  1981-07-07        BSA:          1.690 m Patient Age:    38 years          BP:           114/82 mmHg Patient Gender: M                 HR:           63 bpm. Exam Location:  [address]  Procedure: 2D Echo, 3D Echo, Cardiac Doppler and Color Doppler  Indications:    Q24.9 Congenital Heart Disease  History:        Patient has prior history of Echocardiogram examinations. Aortic Valve Disease and Mitral Valve Disease, Arrythmias:RBBB; Signs/Symptoms:Murmur. 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 Residual MR and AI.  Sonographer:    Farrel Conners RDCS Referring Phys: [id number] Stade  IMPRESSIONS   1. Left ventricular ejection fraction, by estimation, is 60 to 65%. The left ventricle has normal function. The left ventricle has no regional wall motion abnormalities. Left ventricular diastolic parameters were normal. 2. Right ventricular systolic function is normal. The right ventricular size is normal. There is normal pulmonary artery systolic pressure. The estimated right ventricular systolic pressure is 23.1 mmHg. 3. Mild MR, suspect P3 prolapse. Jet is anteriorly directed. Reported history of AV canal defect repair. The mitral valve is myxomatous. Mild mitral valve regurgitation. No evidence of mitral stenosis. 4. Bicuspid AoV with fusion of the NCC/RCC with raphe. Mild regurgitation. The aortic valve is bicuspid. Aortic valve regurgitation is mild. No aortic stenosis is present. 5. The inferior vena cava is normal in size with greater than 50% respiratory variability, suggesting right atrial pressure of 3 mmHg.  Comparison(s): No significant change from prior study.  FINDINGS Left Ventricle: Left ventricular ejection fraction, by estimation, is 60 to 65%. The left ventricle  has normal function. The left ventricle has no regional wall motion abnormalities. The left ventricular internal cavity size was normal in size. There is no left ventricular hypertrophy. Left ventricular diastolic parameters were normal.  Right Ventricle: The right ventricular size is normal. No increase in right ventricular wall thickness. Right ventricular systolic function is normal. There is normal pulmonary artery systolic pressure. The tricuspid regurgitant velocity is 2.24 m/s, and with an assumed right atrial pressure of 3 mmHg, the estimated right ventricular systolic pressure is 23.1 mmHg.  Left Atrium: Left atrial size was normal in size.  Right Atrium: Right atrial size was normal in size.  Pericardium: Trivial pericardial effusion is present.  Mitral Valve: Mild MR, suspect P3  prolapse. Jet is anteriorly directed. Reported history of AV canal defect repair. The mitral valve is myxomatous. Mild mitral valve regurgitation, with anteriorly-directed jet. No evidence of mitral valve stenosis.  Tricuspid Valve: The tricuspid valve is grossly normal. Tricuspid valve regurgitation is mild . No evidence of tricuspid stenosis.  Aortic Valve: Bicuspid AoV with fusion of the NCC/RCC with raphe. Mild regurgitation. The aortic valve is bicuspid. Aortic valve regurgitation is mild. Aortic regurgitation PHT measures 581 msec. No aortic stenosis is present.  Pulmonic Valve: The pulmonic valve was grossly normal. Pulmonic valve regurgitation is trivial. No evidence of pulmonic stenosis.  Aorta: The aortic root and ascending aorta are structurally normal, with no evidence of dilitation.  Venous: The inferior vena cava is normal in size with greater than 50% respiratory variability, suggesting right atrial pressure of 3 mmHg.  IAS/Shunts: The atrial septum is grossly normal.   LEFT VENTRICLE PLAX 2D LVIDd:         5.30 cm  Diastology LVIDs:         3.10 cm  LV e' lateral:   14.30 cm/s LV PW:         0.80 cm  LV E/e' lateral: 9.0 LV IVS:        0.90 cm  LV e' medial:    5.44 cm/s LVOT diam:     2.60 cm  LV E/e' medial:  23.5 LVOT Area:     5.31 cm  3D Volume EF: 3D EF:        64 % LV EDV:       126 ml LV ESV:       46 ml LV SV:        80 ml  RIGHT VENTRICLE RV S prime:     10.40 cm/s TAPSE (M-mode): 2.0 cm  LEFT ATRIUM             Index       RIGHT ATRIUM           Index LA diam:        3.70 cm 2.19 cm/m  RA Area:     12.50 cm LA Vol (A2C):   44.0 ml 26.04 ml/m RA Volume:   33.10 ml  19.59 ml/m LA Vol (A4C):   31.0 ml 18.35 ml/m LA Biplane Vol: 36.8 ml 21.78 ml/m AORTIC VALVE AI PHT:      581 msec  AORTA Ao Root diam: 3.10 cm Ao Asc diam:  3.00 cm  MITRAL VALVE                TRICUSPID VALVE MV Area (PHT): cm          TR Peak grad:   20.1 mmHg MV Decel  Time: 222 msec     TR  Vmax:        224.00 cm/s MR Peak grad: 85.7 mmHg MR Mean grad: 57.0 mmHg     SHUNTS MR Vmax:      463.00 cm/s   Systemic Diam: 2.60 cm MR Vmean:     354.7 cm/s MV E velocity: 128.00 cm/s MV A velocity: 48.10 cm/s MV E/A ratio:  2.66  Lennie Odor MD Electronically signed by Lennie Odor MD Signature Date/Time: 02/25/2020/1:47:19 PM    Final            Recent Labs: No results found for requested labs within last 365 days.  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  Follow-up echocardiogram was recommended as needed/as clinically indicated.  He has a history of varicose veins s/p laser therapy, follows with [facility]. He was last seen in the office on 05/19/2022 and was stable from a cardiac standpoint.   He presents today for follow-up accompanied by his father.  Since his last visit is done well from a cardiac standpoint.  He denies any new symptoms or concerns.  Overall, he reports feeling well.  Home Medications    Current Outpatient Medications  Medication Sig Dispense Refill   acetaminophen (TYLENOL) 500 MG tablet Take 1,000 mg by mouth every 6 (six) hours as needed for fever.     amoxicillin (AMOXIL) 500 MG capsule Take 4 capsules by mouth. 1 hour before dental appointments     cetirizine (ZYRTEC) 10 MG chewable tablet Chew 10 mg by mouth daily.       diclofenac (VOLTAREN) 50 MG EC  tablet      ibuprofen (ADVIL) 800 MG tablet      MODERNA COVID-19 VACCINE 100 MCG/0.5ML injection      Multiple Vitamin (MULTIVITAMIN) tablet Take 1 tablet by mouth daily.       olopatadine (PATANOL) 0.1 % ophthalmic solution Place 1 drop into both eyes as needed for allergies.     Olopatadine HCl 0.6 % SOLN Place 2 puffs into the nose daily as needed (Allergies).     SODIUM FLUORIDE PO Take 1 application by mouth daily.      thyroid (ARMOUR) 15 MG tablet Take 15 mg by mouth daily.     thyroid (ARMOUR) 30 MG tablet Take 30 mg by mouth daily before breakfast.     No current facility-administered medications for this visit.     Review of Systems    He denies chest pain, palpitations, dyspnea, pnd, orthopnea, n, v, dizziness, syncope, edema, weight gain, or early satiety. All other systems reviewed and are otherwise negative except as noted above.   Physical Exam    VS:  BP 114/66 (BP Location: Left Arm, Patient Position: Sitting, Cuff Size: Normal)   Pulse 62   Ht 5\' 5"  (1.651 m)   Wt 144 lb (65.3 kg)   SpO2 98%   BMI 23.96 kg/m   GEN: Well nourished, well developed, in no acute distress. HEENT: normal. Neck: Supple, no JVD, carotid  bruits, or masses. Cardiac: RRR, 2/6 murmur, no rubs, or gallops. No clubbing, cyanosis, edema.  Radials/DP/PT 2+ and equal bilaterally.  Respiratory:  Respirations regular and unlabored, clear to auscultation bilaterally. GI: Soft, nontender, nondistended, BS + x 4. MS: no deformity or atrophy. Skin: warm and dry, no rash. Neuro:  Strength and sensation are intact. Psych: Normal affect.  Accessory Clinical Findings    ECG personally reviewed by me today - EKG Interpretation Date/Time:  Friday July 22 2023 14:18:56 EST Ventricular Rate:  62 PR Interval:  152 QRS Duration:  122 QT Interval:  460 QTC Calculation: 466 R Axis:   -30  Text Interpretation: Normal sinus rhythm Left axis deviation Right bundle branch block No significant change  since last tracing Confirmed by Bernadene Person ([id number]) on 07/22/2023 2:22:11 PM  - no acute changes.   Lab Results  Component Value Date   WBC 3.0 (L) 04/12/2012   HGB 15.1 04/12/2012   HCT 45.1 04/12/2012   MCV 98.4 04/12/2012   PLT 181.0 04/12/2012   Lab Results  Component Value Date   CREATININE 1.2 04/12/2012   BUN 14 04/12/2012   NA 136 04/12/2012   K 4.0 04/12/2012   CL 102 04/12/2012   CO2 27 04/12/2012   Lab Results  Component Value Date   ALT 17 09/20/2011   AST 25 09/20/2011   ALKPHOS 74 09/20/2011   BILITOT 0.7 09/20/2011   No results found for: "CHOL", "HDL", "LDLCALC", "LDLDIRECT", "TRIG", "CHOLHDL"  No results found for: "HGBA1C"  Assessment & Plan    1. 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  Repeat echocardiogram as clinically indicated.  2. RBBB: Stable on today's EKG, in the setting of AV canal defect and repair, stable over the years.  3. Varicose veins: S/p ablative laser treatment, follows with [facility].   4. Hypothyroidism: TSH was 7.42 in 03/2023, managed per PCP.   5. Disposition: Follow-up in 1 year, sooner if needed.       Joylene Grapes, NP 07/22/2023, 2:24 PM

## 2023-07-22 NOTE — Patient Instructions (Signed)
Medication Instructions:  Your physician recommends that you continue on your current medications as directed. Please refer to the Current Medication list given to you today.  *If you need a refill on your cardiac medications before your next appointment, please call your pharmacy*   Lab Work: NONE ordered at this time of appointment    Testing/Procedures: NONE ordered at this time of appointment     Follow-Up: At [facility], you and your health needs are our priority.  As part of our continuing mission to provide you with exceptional heart care, we have created designated Provider Care Teams.  These Care Teams include your primary Cardiologist (physician) and Advanced Practice Providers (APPs -  Physician Assistants and Nurse Practitioners) who all work together to provide you with the care you need, when you need it.  We recommend signing up for the patient portal called "MyChart".  Sign up information is provided on this After Visit Summary.  MyChart is used to connect with patients for Virtual Visits (Telemedicine).  Patients are able to view lab/test results, encounter notes, upcoming appointments, etc.  Non-urgent messages can be sent to your provider as well.   To learn more about what you can do with MyChart, go to [email or web address].    Your next appointment:   1 year(s)  Provider:   Charlton Haws, MD

## 2023-08-12 DIAGNOSIS — E039 Hypothyroidism, unspecified: Secondary | ICD-10-CM | POA: Diagnosis not present

## 2023-08-12 DIAGNOSIS — R7301 Impaired fasting glucose: Secondary | ICD-10-CM | POA: Diagnosis not present

## 2023-09-17 ENCOUNTER — Emergency Department (HOSPITAL_BASED_OUTPATIENT_CLINIC_OR_DEPARTMENT_OTHER)
Admission: EM | Admit: 2023-09-17 | Discharge: 2023-09-17 | Disposition: A | Discharge status: Home / Self Care | Payer: Medicare HMO | Attending: Emergency Medicine | Admitting: Emergency Medicine

## 2023-09-17 ENCOUNTER — Other Ambulatory Visit: Payer: Self-pay

## 2023-09-17 ENCOUNTER — Emergency Department (HOSPITAL_BASED_OUTPATIENT_CLINIC_OR_DEPARTMENT_OTHER): Payer: Medicare HMO

## 2023-09-17 ENCOUNTER — Encounter (HOSPITAL_BASED_OUTPATIENT_CLINIC_OR_DEPARTMENT_OTHER): Payer: Self-pay

## 2023-09-17 DIAGNOSIS — M7989 Other specified soft tissue disorders: Secondary | ICD-10-CM | POA: Diagnosis not present

## 2023-09-17 DIAGNOSIS — M25571 Pain in right ankle and joints of right foot: Secondary | ICD-10-CM | POA: Diagnosis present

## 2023-09-17 DIAGNOSIS — R2241 Localized swelling, mass and lump, right lower limb: Secondary | ICD-10-CM | POA: Diagnosis not present

## 2023-09-17 MED ORDER — IBUPROFEN 400 MG PO TABS
600.0000 mg | ORAL_TABLET | Freq: Once | ORAL | Status: AC
  Administered 2023-09-17: 600 mg via ORAL
  Filled 2023-09-17: qty 1

## 2023-09-17 NOTE — ED Triage Notes (Signed)
 Right ankle swollen and painful. Pt states that last night it was fine, and woke up this morning with these symptoms. Pt did not have an injury.  Anastacio Balm, RN

## 2023-09-17 NOTE — ED Provider Notes (Signed)
 [facility] AT [facility] Provider Note   CSN: [id number] Arrival date & time: 09/17/23  9073     History  Chief Complaint  Patient presents with   Ankle Pain    Roberto Schultz is a 43 y.o. male with a history of Down syndrome who presents to the ED for ankle pain.  Patient was in her normal state of health yesterday was able to complete his chores around the group home.  This morning he awoke with right ankle pain and swelling.  No falls trauma or inciting injury.  Was not particularly active or exerting himself yesterday.  No redness fevers chills or other systemic complaints at this time   Ankle Pain      Home Medications Prior to Admission medications   Medication Sig Start Date End Date Taking? Authorizing Provider  acetaminophen (TYLENOL) 500 MG tablet Take 1,000 mg by mouth every 6 (six) hours as needed for fever.    [provider]  amoxicillin (AMOXIL) 500 MG capsule Take 4 capsules by mouth. 1 hour before dental appointments 12/19/19   [provider]  cetirizine (ZYRTEC) 10 MG chewable tablet Chew 10 mg by mouth daily.      [provider]  diclofenac (VOLTAREN) 50 MG EC tablet  01/25/21   [provider]  ibuprofen  (ADVIL ) 800 MG tablet     [provider]  MODERNA COVID-19 VACCINE 100 MCG/0.5ML injection  03/03/21   [provider]  Multiple Vitamin (MULTIVITAMIN) tablet Take 1 tablet by mouth daily.      [provider]  olopatadine (PATANOL) 0.1 % ophthalmic solution Place 1 drop into both eyes as needed for allergies. 06/20/20   [provider]  Olopatadine HCl 0.6 % SOLN Place 2 puffs into the nose daily as needed (Allergies).    [provider]  SODIUM FLUORIDE PO Take 1 application by mouth daily.     [provider]  thyroid  (ARMOUR) 15 MG tablet Take 15 mg by mouth daily.    [provider]  thyroid  (ARMOUR) 30 MG tablet Take 30 mg  by mouth daily before breakfast.    [provider]      Allergies    Soap    Review of Systems   Review of Systems  Physical Exam Updated Vital Signs BP 120/72 (BP Location: Right Arm)   Pulse 68   Temp 97.8 F (36.6 C) (Oral)   Resp 17   Ht 5' (1.524 m)   Wt 62.6 kg   SpO2 98%   BMI 26.95 kg/m  Physical Exam Vitals and nursing note reviewed.  HENT:     Head: Normocephalic and atraumatic.  Eyes:     Pupils: Pupils are equal, round, and reactive to light.  Cardiovascular:     Rate and Rhythm: Normal rate and regular rhythm.  Pulmonary:     Effort: Pulmonary effort is normal.     Breath sounds: Normal breath sounds.  Abdominal:     Palpations: Abdomen is soft.     Tenderness: There is no abdominal tenderness.  Musculoskeletal:     Comments: Mild edema over right ankle joint with tenderness over the right medial malleolus 5 out of 5 motor strength bilateral feet and ankles with 2+ DP and PT pulses bilaterally Sensation intact to light touch throughout No erythema or increased warmth over right ankle Full active range of motion in the right knee and right hip  Skin:    General:  Skin is warm and dry.  Neurological:     Mental Status: He is alert.  Psychiatric:        Mood and Affect: Mood normal.     ED Results / Procedures / Treatments   Labs (all labs ordered are listed, but only abnormal results are displayed) Labs Reviewed - No data to display  EKG None  Radiology DG Ankle Complete Right Result Date: 09/17/2023 CLINICAL DATA:  Right ankle pain and swelling. EXAM: RIGHT ANKLE - COMPLETE 3+ VIEW COMPARISON:  None Available. FINDINGS: There is no evidence of fracture, dislocation, or joint effusion. There is no evidence of arthropathy or other focal bone abnormality. Soft tissues are unremarkable. IMPRESSION: Negative. Electronically Signed   By: Norleen DELENA Kil M.D.   On: 09/17/2023 10:36    Procedures Procedures    Medications Ordered in  ED Medications  ibuprofen  (ADVIL ) tablet 600 mg (600 mg Oral Given 09/17/23 1014)    ED Course/ Medical Decision Making/ A&P Clinical Course as of 09/17/23 1111  Sat Sep 17, 2023  1109 Reviewed right ankle x-ray.  No osseous abnormality joint effusion or soft tissue abnormality.  Patient reports slight improvement in pain after ibuprofen .  Will provide him with soft ankle brace and instruction for PCP follow-up.  Stable for discharge [MP]    Clinical Course User Index [MP] Pamella Ozell DELENA, DO                                 Medical Decision Making 43 year old male with history as above presenting for atraumatic right ankle pain.  Some soft tissue swelling and tenderness over the medial malleolus without overt evidence of trauma history of trauma increased erythema or warmth.  Suspect this is most likely ankle sprain with some soft tissue swelling.  Lower suspicion for acute gout flare as he has no history of this.  Lower suspicion for septic arthritis given his well appearance and good active and passive range of motion at the ankle.  Will obtain x-ray of the right ankle to look for osseous injury and provide analgesia with ibuprofen .  He took Tylenol prior to arrival.  Amount and/or Complexity of Data Reviewed Radiology: ordered.          Final Clinical Impression(s) / ED Diagnoses Final diagnoses:  Acute right ankle pain    Rx / DC Orders ED Discharge Orders     None         Pamella Ozell DELENA, DO 09/17/23 1111

## 2023-09-17 NOTE — Discharge Instructions (Addendum)
 Medford was seen in the emergency department for right ankle pain The x-ray did not show any abnormalities of the bone, joint or soft tissue Most likely cause of his right ankle pain may be a mild sprain strain For this reason we gave him an ankle brace to wear He should follow-up with his primary care doctor within 1 week for reevaluation Return to the Emergency Department if he is unable to walk as severe pain or any other concerns

## 2023-09-21 ENCOUNTER — Ambulatory Visit: Payer: Medicare HMO | Admitting: Podiatry

## 2023-09-21 ENCOUNTER — Encounter: Payer: Self-pay | Admitting: Podiatry

## 2023-09-21 ENCOUNTER — Ambulatory Visit (INDEPENDENT_AMBULATORY_CARE_PROVIDER_SITE_OTHER): Payer: Medicare HMO | Admitting: Podiatry

## 2023-09-21 VITALS — Ht 60.0 in | Wt 138.0 lb

## 2023-09-21 DIAGNOSIS — M79676 Pain in unspecified toe(s): Secondary | ICD-10-CM

## 2023-09-21 DIAGNOSIS — S99911A Unspecified injury of right ankle, initial encounter: Secondary | ICD-10-CM | POA: Insufficient documentation

## 2023-09-21 DIAGNOSIS — B351 Tinea unguium: Secondary | ICD-10-CM

## 2023-09-21 DIAGNOSIS — Q909 Down syndrome, unspecified: Secondary | ICD-10-CM

## 2023-09-21 DIAGNOSIS — S99911D Unspecified injury of right ankle, subsequent encounter: Secondary | ICD-10-CM

## 2023-09-21 NOTE — Progress Notes (Signed)
This patient returns to the office for evaluation and treatment of long thick painful nails .  This patient is unable to trim his own nails since the patient cannot reach his feet.  Patient says the nails are painful walking and wearing his shoes.    He returns for preventive foot care services. Patient has history of right ankle injury treated at ER with boot.  Patient says he has no problem since wearing his boot.  General Appearance  Alert, conversant and in no acute stress.  Vascular  Dorsalis pedis and posterior tibial  pulses are palpable  bilaterally.  Capillary return is within normal limits  bilaterally. Temperature is within normal limits  bilaterally.  Neurologic  Senn-Weinstein monofilament wire test within normal limits  bilaterally. Muscle power within normal limits bilaterally.  Nails Thick disfigured discolored nails with subungual debris  from hallux to fifth toes bilaterally. No evidence of bacterial infection or drainage bilaterally.  Orthopedic  No limitations of motion  feet .  No crepitus or effusions noted.  No bony pathology or digital deformities noted.  Mild  HAV  B/L. No ankle pain.  Skin  normotropic skin with no porokeratosis noted bilaterally.  No signs of infections or ulcers noted.     Onychomycosis  Pain in toes right foot  Pain in toes left foot  Debridement  of nails  1-5  B/L with a nail nipper.  Nails were then filed using a dremel tool with no incidents.    RTC 4 months.     Helane Gunther DPM

## 2023-11-18 DIAGNOSIS — H10413 Chronic giant papillary conjunctivitis, bilateral: Secondary | ICD-10-CM | POA: Diagnosis not present

## 2024-01-06 DIAGNOSIS — Z125 Encounter for screening for malignant neoplasm of prostate: Secondary | ICD-10-CM | POA: Diagnosis not present

## 2024-01-06 DIAGNOSIS — Z0001 Encounter for general adult medical examination with abnormal findings: Secondary | ICD-10-CM | POA: Diagnosis not present

## 2024-01-06 DIAGNOSIS — D72819 Decreased white blood cell count, unspecified: Secondary | ICD-10-CM | POA: Diagnosis not present

## 2024-01-06 DIAGNOSIS — Q909 Down syndrome, unspecified: Secondary | ICD-10-CM | POA: Diagnosis not present

## 2024-01-06 DIAGNOSIS — F79 Unspecified intellectual disabilities: Secondary | ICD-10-CM | POA: Diagnosis not present

## 2024-01-06 DIAGNOSIS — R7301 Impaired fasting glucose: Secondary | ICD-10-CM | POA: Diagnosis not present

## 2024-01-06 DIAGNOSIS — E039 Hypothyroidism, unspecified: Secondary | ICD-10-CM | POA: Diagnosis not present

## 2024-01-23 ENCOUNTER — Encounter: Payer: Self-pay | Admitting: Podiatry

## 2024-01-23 ENCOUNTER — Ambulatory Visit (INDEPENDENT_AMBULATORY_CARE_PROVIDER_SITE_OTHER): Payer: Medicare HMO | Admitting: Podiatry

## 2024-01-23 DIAGNOSIS — Q909 Down syndrome, unspecified: Secondary | ICD-10-CM

## 2024-01-23 DIAGNOSIS — M79676 Pain in unspecified toe(s): Secondary | ICD-10-CM | POA: Diagnosis not present

## 2024-01-23 DIAGNOSIS — B351 Tinea unguium: Secondary | ICD-10-CM | POA: Diagnosis not present

## 2024-01-23 NOTE — Progress Notes (Addendum)
 This patient returns to the office for evaluation and treatment of long thick painful nails .  This patient is unable to trim his own nails since the patient cannot reach his feet.  Patient says the nails are painful walking and wearing his shoes.    He returns for preventive foot care services. Patient has history of right ankle injury treated at ER with boot.  Patient says he has no problem since wearing his boot.  General Appearance  Alert, conversant and in no acute stress.  Vascular  Dorsalis pedis and posterior tibial  pulses are palpable  bilaterally.  Capillary return is within normal limits  bilaterally. Temperature is within normal limits  bilaterally.  Neurologic  Senn-Weinstein monofilament wire test within normal limits  bilaterally. Muscle power within normal limits bilaterally.  Nails Thick disfigured discolored nails with subungual debris  from hallux to fifth toes bilaterally. No evidence of bacterial infection or drainage bilaterally.  Orthopedic  No limitations of motion  feet .  No crepitus or effusions noted.  No bony pathology or digital deformities noted.  Mild  HAV  B/L. No ankle pain.  Skin  normotropic skin with no porokeratosis noted bilaterally.  No signs of infections or ulcers noted.     Onychomycosis  Pain in toes right foot  Pain in toes left foot  Debridement  of nails  1-5  B/L with a nail nipper.  Nails were then filed using a dremel tool with no incidents.    RTC 3 months.     Ruffin Cotton DPM

## 2024-04-23 ENCOUNTER — Ambulatory Visit (INDEPENDENT_AMBULATORY_CARE_PROVIDER_SITE_OTHER): Admitting: Podiatry

## 2024-04-23 ENCOUNTER — Encounter: Payer: Self-pay | Admitting: Podiatry

## 2024-04-23 DIAGNOSIS — B351 Tinea unguium: Secondary | ICD-10-CM | POA: Diagnosis not present

## 2024-04-23 DIAGNOSIS — Q909 Down syndrome, unspecified: Secondary | ICD-10-CM

## 2024-04-23 DIAGNOSIS — M79676 Pain in unspecified toe(s): Secondary | ICD-10-CM | POA: Diagnosis not present

## 2024-04-23 NOTE — Progress Notes (Signed)
 This patient returns to the office for evaluation and treatment of long thick painful nails .  This patient is unable to trim his own nails since the patient cannot reach his feet.  Patient says the nails are painful walking and wearing his shoes.    He returns for preventive foot care services.   General Appearance  Alert, conversant and in no acute stress.  Vascular  Dorsalis pedis and posterior tibial  pulses are palpable  bilaterally.  Capillary return is within normal limits  bilaterally. Temperature is within normal limits  bilaterally.  Neurologic  Senn-Weinstein monofilament wire test within normal limits  bilaterally. Muscle power within normal limits bilaterally.  Nails Thick disfigured discolored nails with subungual debris  from hallux to fifth toes bilaterally. No evidence of bacterial infection or drainage bilaterally.  Orthopedic  No limitations of motion  feet .  No crepitus or effusions noted.  No bony pathology or digital deformities noted.  Mild  HAV  B/L. No ankle pain.  Skin  normotropic skin with no porokeratosis noted bilaterally.  No signs of infections or ulcers noted.   Two sites of dry peeling skin interdigitally 3,4 left foot.  Onychomycosis  Pain in toes right foot  Pain in toes left foot  Debridement  of nails  1-5  B/L with a nail nipper.  Nails were then filed using a dremel tool with no incidents.  Told him to use powders since I do not see an active skin condition interdigitally.  RTC 3 months.       Cordella Bold DPM

## 2024-05-31 DIAGNOSIS — M25551 Pain in right hip: Secondary | ICD-10-CM | POA: Diagnosis not present

## 2024-06-13 DIAGNOSIS — M1611 Unilateral primary osteoarthritis, right hip: Secondary | ICD-10-CM | POA: Diagnosis not present

## 2024-07-11 DIAGNOSIS — M1611 Unilateral primary osteoarthritis, right hip: Secondary | ICD-10-CM | POA: Diagnosis not present

## 2024-07-23 ENCOUNTER — Ambulatory Visit: Admitting: Podiatry

## 2024-08-22 ENCOUNTER — Telehealth (HOSPITAL_BASED_OUTPATIENT_CLINIC_OR_DEPARTMENT_OTHER): Payer: Self-pay

## 2024-08-22 NOTE — Telephone Encounter (Signed)
"  ° °  Pre-operative Risk Assessment    Patient Name: Roberto Schultz  DOB: 04-03-1981 MRN: [id number]   Date of last office visit: 07/22/23 with [place] Date of next office visit: NA  Request for Surgical Clearance    Procedure:  Right total hip arthroplasty   Date of Surgery:  Clearance 11/08/24                                  Surgeon:  Dr. Ernie Socks Group or Practice Name:  [facility]  Phone number:  [phone] Fax number:  [phone]   Type of Clearance Requested:   - Medical    Type of Anesthesia:  Spinal   Additional requests/questions:    SignedAugustin JONETTA Daring   08/22/2024, 10:04 AM   "

## 2024-08-22 NOTE — Telephone Encounter (Signed)
 I s/w the pt's mother-DPR who scheduled in office appt 09/13/24 Katlyn West, NP. Pt's mother tells me that the surgeon office has him on a wait list and she wants to try and get the surgery done for the pt as soon as they can.   I will update all parties involved. At this point the 09/13/24 appt is even in our preop guidelines of no more than 2 months old.

## 2024-08-22 NOTE — Telephone Encounter (Signed)
" ° °  Name: Roberto Schultz  DOB: 08/20/1980  MRN: [id number]  Primary Cardiologist: Maude Emmer, MD  Chart reviewed as part of pre-operative protocol coverage. Patient has not been seen in our office in over 1 year (since 07/2023). Because of Roberto Schultz past medical history and time since last visit, he will require a follow-up in-office visit in order to better assess preoperative cardiovascular risk.  Pre-op covering staff: - Please schedule appointment and call patient to inform them. If patient already had an upcoming appointment within acceptable timeframe, please add pre-op clearance to the appointment notes so provider is aware. - Please contact requesting surgeon's office via preferred method (i.e, phone, fax) to inform them of need for appointment prior to surgery.  He does not appear to be on any anticoagulation/ antiplatelets.   Alechia Lezama E Aniyah Nobis, PA-C  08/22/2024, 10:22 AM   "

## 2024-08-29 NOTE — Telephone Encounter (Signed)
" ° °  Name: Kindred Reidinger  DOB: [birth year]  MRN: [id number]  Primary Cardiologist: Maude Emmer, MD  Chart reviewed as part of pre-operative protocol coverage. Because of Amad Mau past medical history and time since last visit, he will require a follow-up in-office visit in order to better assess preoperative cardiovascular risk.  Patient has an office visit scheduled on 09/13/24 with Katlyn West, NP-C. Appointment notes have been updated to reflect need for pre-op evaluation.   Pre-op covering staff:  - Please contact requesting surgeon's office via preferred method (i.e, phone, fax) to inform them of need for appointment prior to surgery.  Mardy KATHEE Pizza, FNP  08/29/2024, 1:14 PM    "

## 2024-08-29 NOTE — Telephone Encounter (Signed)
 Received updated clearance with new surgery date of 09/25/2024

## 2024-08-29 NOTE — Telephone Encounter (Signed)
 OV preop clearance appt now scheduled

## 2024-08-31 ENCOUNTER — Ambulatory Visit: Admitting: Podiatry

## 2024-09-13 ENCOUNTER — Encounter: Payer: Self-pay | Admitting: Cardiology

## 2024-09-13 ENCOUNTER — Ambulatory Visit: Admitting: Cardiology

## 2024-09-13 VITALS — BP 112/62 | HR 81 | Ht 62.0 in | Wt 148.0 lb

## 2024-09-13 DIAGNOSIS — E039 Hypothyroidism, unspecified: Secondary | ICD-10-CM | POA: Diagnosis not present

## 2024-09-13 DIAGNOSIS — I83892 Varicose veins of left lower extremities with other complications: Secondary | ICD-10-CM | POA: Diagnosis not present

## 2024-09-13 DIAGNOSIS — Q2381 Bicuspid aortic valve: Secondary | ICD-10-CM | POA: Diagnosis not present

## 2024-09-13 DIAGNOSIS — Q249 Congenital malformation of heart, unspecified: Secondary | ICD-10-CM

## 2024-09-13 DIAGNOSIS — I4510 Unspecified right bundle-branch block: Secondary | ICD-10-CM

## 2024-09-13 NOTE — Patient Instructions (Signed)
 Medication Instructions:  Your physician recommends that you continue on your current medications as directed. Please refer to the Current Medication list given to you today.  *If you need a refill on your cardiac medications before your next appointment, please call your pharmacy*  Lab Work: NONE If you have labs (blood work) drawn today and your tests are completely normal, you will receive your results only by: MyChart Message (if you have MyChart) OR A paper copy in the mail If you have any lab test that is abnormal or we need to change your treatment, we will call you to review the results.  Testing/Procedures: Your physician has requested that you have an echocardiogram. Echocardiography is a painless test that uses sound waves to create images of your heart. It provides your doctor with information about the size and shape of your heart and how well your heart's chambers and valves are working. This procedure takes approximately one hour. There are no restrictions for this procedure. Please do NOT wear cologne, perfume, aftershave, or lotions (deodorant is allowed). Please arrive 15 minutes prior to your appointment time.  Please note: We ask at that you not bring children with you during ultrasound (echo/ vascular) testing. Due to room size and safety concerns, children are not allowed in the ultrasound rooms during exams. Our front office staff cannot provide observation of children in our lobby area while testing is being conducted. An adult accompanying a patient to their appointment will only be allowed in the ultrasound room at the discretion of the ultrasound technician under special circumstances. We apologize for any inconvenience.   Follow-Up: At [facility], you and your health needs are our priority.  As part of our continuing mission to provide you with exceptional heart care, our providers are all part of one team.  This team includes your primary Cardiologist  (physician) and Advanced Practice Providers or APPs (Physician Assistants and Nurse Practitioners) who all work together to provide you with the care you need, when you need it.  Your next appointment:   1 year(s)  Provider:   Maude Emmer, MD

## 2024-09-13 NOTE — Patient Instructions (Signed)
 SURGICAL WAITING ROOM VISITATION Patients having surgery or a procedure may have no more than 2 support people in the waiting area - these visitors may rotate in the visitor waiting room.   Due to an increase in RSV and influenza rates and associated hospitalizations, children ages 82 and under may not visit patients in [facility] hospitals. If the patient needs to stay at the hospital during part of their recovery, the visitor guidelines for inpatient rooms apply.  PRE-OP VISITATION  Pre-op nurse will coordinate an appropriate time for 1 support person to accompany the patient in pre-op.  This support person may not rotate.  This visitor will be contacted when the time is appropriate for the visitor to come back in the pre-op area.  Please refer to the [facility] website for the visitor guidelines for Inpatients (after your surgery is over and you are in a regular room).  You are not required to quarantine at this time prior to your surgery. However, you must do this: Hand Hygiene often Do NOT share personal items Notify your provider if you are in close contact with someone who has COVID or you develop fever 100.4 or greater, new onset of sneezing, cough, sore throat, shortness of breath or body aches.  If you test positive for Covid or have been in contact with anyone that has tested positive in the last 10 days please notify you surgeon.    Your procedure is scheduled on:  09/25/24  Report to [facility] Main Entrance: [place] entrance where the [employer] is available.   Report to admitting at: 1:15 PM  Call this number if you have any questions or problems the morning of surgery [phone]  FOLLOW ANY ADDITIONAL PRE OP INSTRUCTIONS YOU RECEIVED FROM YOUR SURGEON'S OFFICE!!!  Do not eat food after Midnight the night prior to your surgery/procedure.  After Midnight you may have the following liquids until: 12:45 PM DAY OF SURGERY  Clear Liquid Diet Water Black  Coffee (sugar ok, NO MILK/CREAM OR CREAMERS)  Tea (sugar ok, NO MILK/CREAM OR CREAMERS) regular and decaf                             Plain Jell-O  with no fruit (NO RED)                                           Fruit ices (not with fruit pulp, NO RED)                                     Popsicles (NO RED)                                                                  Juice: NO CITRUS JUICES: only apple, WHITE grape, WHITE cranberry Sports drinks like Gatorade or Powerade (NO RED)    The day of surgery:  Drink ONE (1) Pre-Surgery Clear Ensure at : 12:45 PM the morning of surgery. Drink in one sitting. Do not sip.  This drink was  given to you during your hospital pre-op appointment visit. Nothing else to drink after completing the Pre-Surgery Clear Ensure or G2 : No candy, chewing gum or throat lozenges.    Oral Hygiene is also important to reduce your risk of infection.        Remember - BRUSH YOUR TEETH THE MORNING OF SURGERY WITH YOUR REGULAR TOOTHPASTE  Do NOT smoke after Midnight the night before surgery.  STOP TAKING all Vitamins, Herbs and supplements 1 week before your surgery.   Take ONLY these medicines the morning of surgery with A SIP OF WATER: Thyroid (armour)                  You may not have any metal on your body including hair pins, jewelry, and body piercing  Do not wear lotions, powders, perfumes / cologne, or deodorant  Men may shave face and neck.  Contacts, Hearing Aids, dentures or bridgework may not be worn into surgery. DENTURES WILL BE REMOVED PRIOR TO SURGERY PLEASE DO NOT APPLY Poly grip OR ADHESIVES!!!  You may bring a small overnight bag with you on the day of surgery, only pack items that are not valuable. [facility] IS NOT RESPONSIBLE   FOR VALUABLES THAT ARE LOST OR STOLEN.   Patients discharged on the day of surgery will not be allowed to drive home.  Someone NEEDS to stay with you for the first 24 hours after anesthesia.  Do not bring your  home medications to the hospital. The Pharmacy will dispense medications listed on your medication list to you during your admission in the Hospital.  Special Instructions: Bring a copy of your healthcare power of attorney and living will documents the day of surgery, if you wish to have them scanned into your [facility] Records- EPIC  Please read over the following fact sheets you were given: IF YOU HAVE QUESTIONS ABOUT YOUR PRE-OP INSTRUCTIONS, PLEASE CALL [phone]  PATIENT SIGNATURE_________________________________  NURSE SIGNATURE__________________________________  ________________________________________________________________________  Pre-operative 4 CHG Bath Instructions  DYNA-Hex 4 Chlorhexidine Gluconate 4% Solution Antiseptic 4 fl. oz   You can play a key role in reducing the risk of infection after surgery. Your skin needs to be as free of germs as possible. You can reduce the number of germs on your skin by washing with CHG (chlorhexidine gluconate) soap before surgery. CHG is an antiseptic soap that kills germs and continues to kill germs even after washing.   DO NOT use if you have an allergy to chlorhexidine/CHG or antibacterial soaps. If your skin becomes reddened or irritated, stop using the CHG and notify one of our RNs at   Please shower with the CHG soap starting 4 days before surgery using the following schedule:     Please keep in mind the following:  DO NOT shave, including legs and underarms, starting the day of your first shower.   You may shave your face at any point before/day of surgery.  Place clean sheets on your bed the day you start using CHG soap. Use a clean washcloth (not used since being washed) for each shower. DO NOT sleep with pets once you start using the CHG.  CHG Shower Instructions:  If you choose to wash your hair and private area, wash first with your normal shampoo/soap.  After you use shampoo/soap, rinse your hair and body  thoroughly to remove shampoo/soap residue.  Turn the water OFF and apply about 3 tablespoons (45 ml) of CHG soap to a CLEAN washcloth.  Apply CHG soap ONLY FROM YOUR NECK DOWN TO YOUR TOES (washing for 3-5 minutes)  DO NOT use CHG soap on face, private areas, open wounds, or sores.  Pay special attention to the area where your surgery is being performed.  If you are having back surgery, having someone wash your back for you may be helpful. Wait 2 minutes after CHG soap is applied, then you may rinse off the CHG soap.  Pat dry with a clean towel  Put on clean clothes/pajamas   If you choose to wear lotion, please use ONLY the CHG-compatible lotions on the back of this paper.     Additional instructions for the day of surgery: DO NOT APPLY any lotions, deodorants, cologne, or perfumes.   Put on clean/comfortable clothes.  Brush your teeth.  Ask your nurse before applying any prescription medications to the skin.   CHG Compatible Lotions   Aveeno Moisturizing lotion  Cetaphil Moisturizing Cream  Cetaphil Moisturizing Lotion  Clairol Herbal Essence Moisturizing Lotion, Dry Skin  Clairol Herbal Essence Moisturizing Lotion, Extra Dry Skin  Clairol Herbal Essence Moisturizing Lotion, Normal Skin  Curel Age Defying Therapeutic Moisturizing Lotion with Alpha Hydroxy  Curel Extreme Care Body Lotion  Curel Soothing Hands Moisturizing Hand Lotion  Curel Therapeutic Moisturizing Cream, Fragrance-Free  Curel Therapeutic Moisturizing Lotion, Fragrance-Free  Curel Therapeutic Moisturizing Lotion, Original Formula  Eucerin Daily Replenishing Lotion  Eucerin Dry Skin Therapy Plus Alpha Hydroxy Crme  Eucerin Dry Skin Therapy Plus Alpha Hydroxy Lotion  Eucerin Original Crme  Eucerin Original Lotion  Eucerin Plus Crme Eucerin Plus Lotion  Eucerin TriLipid Replenishing Lotion  Keri Anti-Bacterial Hand Lotion  Keri Deep Conditioning Original Lotion Dry Skin Formula Softly Scented  Keri Deep  Conditioning Original Lotion, Fragrance Free Sensitive Skin Formula  Keri Lotion Fast Absorbing Fragrance Free Sensitive Skin Formula  Keri Lotion Fast Absorbing Softly Scented Dry Skin Formula  Keri Original Lotion  Keri Skin Renewal Lotion Keri Silky Smooth Lotion  Keri Silky Smooth Sensitive Skin Lotion  Nivea Body Creamy Conditioning Oil  Nivea Body Extra Enriched Lotion  Nivea Body Original Lotion  Nivea Body Sheer Moisturizing Lotion Nivea Crme  Nivea Skin Firming Lotion  NutraDerm 30 Skin Lotion  NutraDerm Skin Lotion  NutraDerm Therapeutic Skin Cream  NutraDerm Therapeutic Skin Lotion  ProShield Protective Hand Cream  Provon moisturizing lotion  Incentive Spirometer  An incentive spirometer is a tool that can help keep your lungs clear and active. This tool measures how well you are filling your lungs with each breath. Taking long deep breaths may help reverse or decrease the chance of developing breathing (pulmonary) problems (especially infection) following: A long period of time when you are unable to move or be active. BEFORE THE PROCEDURE  If the spirometer includes an indicator to show your best effort, your nurse or respiratory therapist will set it to a desired goal. If possible, sit up straight or lean slightly forward. Try not to slouch. Hold the incentive spirometer in an upright position. INSTRUCTIONS FOR USE  Sit on the edge of your bed if possible, or sit up as far as you can in bed or on a chair. Hold the incentive spirometer in an upright position. Breathe out normally. Place the mouthpiece in your mouth and seal your lips tightly around it. Breathe in slowly and as deeply as possible, raising the piston or the ball toward the top of the column. Hold your breath for 3-5 seconds or for as long as possible. Allow  the piston or ball to fall to the bottom of the column. Remove the mouthpiece from your mouth and breathe out normally. Rest for a few seconds and  repeat Steps 1 through 7 at least 10 times every 1-2 hours when you are awake. Take your time and take a few normal breaths between deep breaths. The spirometer may include an indicator to show your best effort. Use the indicator as a goal to work toward during each repetition. After each set of 10 deep breaths, practice coughing to be sure your lungs are clear. If you have an incision (the cut made at the time of surgery), support your incision when coughing by placing a pillow or rolled up towels firmly against it. Once you are able to get out of bed, walk around indoors and cough well. You may stop using the incentive spirometer when instructed by your caregiver.  RISKS AND COMPLICATIONS Take your time so you do not get dizzy or light-headed. If you are in pain, you may need to take or ask for pain medication before doing incentive spirometry. It is harder to take a deep breath if you are having pain. AFTER USE Rest and breathe slowly and easily. It can be helpful to keep track of a log of your progress. Your caregiver can provide you with a simple table to help with this. If you are using the spirometer at home, follow these instructions: SEEK MEDICAL CARE IF:  You are having difficultly using the spirometer. You have trouble using the spirometer as often as instructed. Your pain medication is not giving enough relief while using the spirometer. You develop fever of 100.5 F (38.1 C) or higher. SEEK IMMEDIATE MEDICAL CARE IF:  You cough up bloody sputum that had not been present before. You develop fever of 102 F (38.9 C) or greater. You develop worsening pain at or near the incision site. MAKE SURE YOU:  Understand these instructions. Will watch your condition. Will get help right away if you are not doing well or get worse. Document Released: 12/06/2006 Document Revised: 10/18/2011 Document Reviewed: 02/06/2007 [facility] Patient Information 2014 [place].   ________________________________________________________________________

## 2024-09-14 ENCOUNTER — Other Ambulatory Visit: Payer: Self-pay

## 2024-09-14 ENCOUNTER — Encounter (HOSPITAL_COMMUNITY): Payer: Self-pay

## 2024-09-14 ENCOUNTER — Encounter (HOSPITAL_COMMUNITY): Admission: RE | Admit: 2024-09-14

## 2024-09-14 VITALS — BP 105/63 | HR 81 | Temp 98.0°F | Ht 62.0 in | Wt 142.0 lb

## 2024-09-14 DIAGNOSIS — I519 Heart disease, unspecified: Secondary | ICD-10-CM

## 2024-09-14 DIAGNOSIS — M1611 Unilateral primary osteoarthritis, right hip: Secondary | ICD-10-CM

## 2024-09-14 DIAGNOSIS — Z01818 Encounter for other preprocedural examination: Secondary | ICD-10-CM

## 2024-09-14 HISTORY — DX: Unspecified osteoarthritis, unspecified site: M19.90

## 2024-09-14 HISTORY — DX: Hypothyroidism, unspecified: E03.9

## 2024-09-14 HISTORY — DX: Cardiac murmur, unspecified: R01.1

## 2024-09-14 LAB — CBC
HCT: 44.1 % (ref 39.0–52.0)
Hemoglobin: 15 g/dL (ref 13.0–17.0)
MCH: 33.1 pg (ref 26.0–34.0)
MCHC: 34 g/dL (ref 30.0–36.0)
MCV: 97.4 fL (ref 80.0–100.0)
Platelets: 217 10*3/uL (ref 150–400)
RBC: 4.53 MIL/uL (ref 4.22–5.81)
RDW: 13.2 % (ref 11.5–15.5)
WBC: 3.1 10*3/uL — ABNORMAL LOW (ref 4.0–10.5)
nRBC: 0 % (ref 0.0–0.2)

## 2024-09-14 LAB — BASIC METABOLIC PANEL WITH GFR
Anion gap: 10 (ref 5–15)
BUN: 13 mg/dL (ref 6–20)
CO2: 26 mmol/L (ref 22–32)
Calcium: 9 mg/dL (ref 8.9–10.3)
Chloride: 103 mmol/L (ref 98–111)
Creatinine, Ser: 1.1 mg/dL (ref 0.61–1.24)
GFR, Estimated: >60 mL/min
Glucose, Bld: 90 mg/dL (ref 70–99)
Potassium: 4.1 mmol/L (ref 3.5–5.1)
Sodium: 139 mmol/L (ref 135–145)

## 2024-09-14 LAB — SURGICAL PCR SCREEN
MRSA, PCR: NEGATIVE
Staphylococcus aureus: NEGATIVE

## 2024-09-14 LAB — TYPE AND SCREEN
ABO/RH(D): AB POS
Antibody Screen: NEGATIVE

## 2024-09-14 NOTE — Progress Notes (Signed)
 For Anesthesia: PCP -  Cardiologist -  Clearance: West, Katlyn D, NP  Bowel Prep reminder:  Chest x-ray -  EKG -  Stress Test -  ECHO -  Cardiac Cath -  Pacemaker/ICD device last checked: Pacemaker orders received: Device Rep notified:  Spinal Cord Stimulator:N/A  Sleep Study - N/A CPAP -   Fasting Blood Sugar - N/A Checks Blood Sugar _____ times a day Date and result of last Hgb A1c-  Last dose of GLP1 agonist- N/A GLP1 instructions: Hold 7 days prior to schedule (Hold 24 hours-daily)   Last dose of SGLT-2 inhibitors- N/A SGLT-2 instructions: Hold 72 hours prior to surgery  Blood Thinner Instructions:N/A Last Dose: Time last taken:  Aspirin Instructions:N/A Last Dose: Time last taken:  Activity level: Can go up a flight of stairs and activities of daily living without stopping and without chest pain and/or shortness of breath   Able to exercise without chest pain and/or shortness of breath  Anesthesia review: Hx: 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   Patient denies shortness of breath, fever, cough and chest pain at PAT appointment   Patient verbalized understanding of instructions that were reviewed over the telephone.

## 2024-09-17 ENCOUNTER — Ambulatory Visit: Admitting: General Practice

## 2024-09-25 ENCOUNTER — Ambulatory Visit (HOSPITAL_COMMUNITY): Admit: 2024-09-25 | Admitting: Orthopedic Surgery

## 2024-09-25 ENCOUNTER — Encounter (HOSPITAL_COMMUNITY): Admission: RE | Payer: Self-pay | Source: Ambulatory Visit

## 2024-09-25 DIAGNOSIS — M1611 Unilateral primary osteoarthritis, right hip: Secondary | ICD-10-CM

## 2024-09-25 SURGERY — ARTHROPLASTY, HIP, TOTAL, ANTERIOR APPROACH
Anesthesia: Spinal | Site: Hip | Laterality: Right

## 2024-11-14 ENCOUNTER — Ambulatory Visit (HOSPITAL_COMMUNITY)
